# Patient Record
Sex: Male | Born: 1965 | Race: White | Hispanic: No | Smoking: Never smoker
Health system: Southern US, Community
[De-identification: ages and names within clinical notes are randomized; demographics above are authoritative.]

## PROBLEM LIST (undated history)

## (undated) DIAGNOSIS — E785 Hyperlipidemia, unspecified: Secondary | ICD-10-CM

## (undated) DIAGNOSIS — I739 Peripheral vascular disease, unspecified: Secondary | ICD-10-CM

## (undated) DIAGNOSIS — K3184 Gastroparesis: Secondary | ICD-10-CM

## (undated) DIAGNOSIS — K5900 Constipation, unspecified: Secondary | ICD-10-CM

## (undated) DIAGNOSIS — I10 Essential (primary) hypertension: Secondary | ICD-10-CM

## (undated) DIAGNOSIS — G629 Polyneuropathy, unspecified: Secondary | ICD-10-CM

## (undated) DIAGNOSIS — F419 Anxiety disorder, unspecified: Secondary | ICD-10-CM

## (undated) DIAGNOSIS — R339 Retention of urine, unspecified: Secondary | ICD-10-CM

## (undated) DIAGNOSIS — R569 Unspecified convulsions: Secondary | ICD-10-CM

## (undated) DIAGNOSIS — O2230 Deep phlebothrombosis in pregnancy, unspecified trimester: Secondary | ICD-10-CM

## (undated) DIAGNOSIS — K819 Cholecystitis, unspecified: Secondary | ICD-10-CM

## (undated) DIAGNOSIS — E119 Type 2 diabetes mellitus without complications: Secondary | ICD-10-CM

## (undated) DIAGNOSIS — I639 Cerebral infarction, unspecified: Secondary | ICD-10-CM

## (undated) DIAGNOSIS — L409 Psoriasis, unspecified: Secondary | ICD-10-CM

---

## 2017-08-23 ENCOUNTER — Emergency Department (HOSPITAL_COMMUNITY)
Admission: EM | Admit: 2017-08-23 | Discharge: 2017-08-23 | Disposition: A | Discharge status: Home / Self Care | Payer: Medicare Other | Attending: Emergency Medicine | Admitting: Emergency Medicine

## 2017-08-23 ENCOUNTER — Emergency Department (HOSPITAL_COMMUNITY): Payer: Medicare Other

## 2017-08-23 DIAGNOSIS — Y713 Surgical instruments, materials and cardiovascular devices (including sutures) associated with adverse incidents: Secondary | ICD-10-CM | POA: Insufficient documentation

## 2017-08-23 DIAGNOSIS — T82598A Other mechanical complication of other cardiac and vascular devices and implants, initial encounter: Secondary | ICD-10-CM | POA: Diagnosis present

## 2017-08-23 DIAGNOSIS — T82528A Displacement of other cardiac and vascular devices and implants, initial encounter: Secondary | ICD-10-CM

## 2017-08-23 DIAGNOSIS — Z95828 Presence of other vascular implants and grafts: Secondary | ICD-10-CM

## 2017-08-23 MED ORDER — SODIUM CHLORIDE 0.9% FLUSH
10.0000 mL | Freq: Two times a day (BID) | INTRAVENOUS | Status: DC
  Administered 2017-08-23: 10 mL

## 2017-08-23 MED ORDER — SODIUM CHLORIDE 0.9% FLUSH: 10.0000 mL | INTRAVENOUS | Status: DC | PRN

## 2017-08-23 NOTE — Progress Notes (Signed)
Peripherally Inserted Central Catheter/Midline Placement  The IV Nurse has discussed with the patient and/or persons authorized to consent for the patient, the purpose of this procedure and the potential benefits and risks involved with this procedure.  The benefits include less needle sticks, lab draws from the catheter, and the patient may be discharged home with the catheter. Risks include, but not limited to, infection, bleeding, blood clot (thrombus formation), and puncture of an artery; nerve damage and irregular heartbeat and possibility to perform a PICC exchange if needed/ordered by physician.  Alternatives to this procedure were also discussed.  B[employer] PICC patient education guide, fact sheet on infection prevention and patient information card has been provided to patient /or left at bedside.    PICC/Midline Placement Documentation  PICC Single Lumen 08/23/17 PICC Right Basilic 39 cm 0 cm (Active)  Indication for Insertion or Continuance of Line Home intravenous therapies (PICC only) 08/23/2017  7:00 PM  Exposed Catheter (cm) 0 cm 08/23/2017  7:00 PM  Dressing Change Due 08/30/17 08/23/2017  7:00 PM   Patient alert and oriented. This is his 5th PICC. States that he pulls the PICC out with his teeth when he gets bored. Soft sleeve placed over PICC site. Recommend tunneled line in chest if patient continues to dislodge PICC. Patient is aware of risk associated with this activity.    Romie Jumper 08/23/2017, 7:14 PM

## 2017-08-23 NOTE — ED Notes (Signed)
Unable to obtain triage hx review from pt due to cognitive deficits. Facility did not send medics with a medical record copy.

## 2017-08-23 NOTE — ED Notes (Signed)
Tried to call Lacinda Axon to give report. No answer. Plan to give bedside report to medics at discharge.

## 2017-08-23 NOTE — ED Triage Notes (Signed)
Pt has been sent from an SNF for a PICC line placement after the old one "reportedly came out."
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
## 2017-08-23 NOTE — Discharge Instructions (Signed)
PICC line was replaced today.  Please keep well protected so that patient will not pull.   Please return for worsening symptoms, including fever, confusion, redness/swelling around PICC line or any other symptoms concerning to you.

## 2017-08-23 NOTE — ED Notes (Signed)
Bed: [facility] Expected date:  Expected time:  Means of arrival:  Comments: EMS-needs PICC placed

## 2017-08-31 ENCOUNTER — Encounter (HOSPITAL_COMMUNITY): Payer: Self-pay | Admitting: Emergency Medicine

## 2017-08-31 ENCOUNTER — Emergency Department (HOSPITAL_COMMUNITY): Payer: Medicare Other

## 2017-08-31 ENCOUNTER — Inpatient Hospital Stay (HOSPITAL_COMMUNITY)
Admission: EM | Admit: 2017-08-31 | Discharge: 2017-09-06 | DRG: 071 | Disposition: A | Discharge status: Skilled Nursing Facility | Payer: Medicare Other | Attending: Internal Medicine | Admitting: Internal Medicine

## 2017-08-31 DIAGNOSIS — R4182 Altered mental status, unspecified: Secondary | ICD-10-CM | POA: Diagnosis not present

## 2017-08-31 DIAGNOSIS — F419 Anxiety disorder, unspecified: Secondary | ICD-10-CM | POA: Diagnosis present

## 2017-08-31 DIAGNOSIS — E785 Hyperlipidemia, unspecified: Secondary | ICD-10-CM | POA: Diagnosis present

## 2017-08-31 DIAGNOSIS — I69320 Aphasia following cerebral infarction: Secondary | ICD-10-CM | POA: Diagnosis not present

## 2017-08-31 DIAGNOSIS — Z515 Encounter for palliative care: Secondary | ICD-10-CM | POA: Diagnosis present

## 2017-08-31 DIAGNOSIS — I5030 Unspecified diastolic (congestive) heart failure: Secondary | ICD-10-CM | POA: Diagnosis not present

## 2017-08-31 DIAGNOSIS — I248 Other forms of acute ischemic heart disease: Secondary | ICD-10-CM | POA: Diagnosis present

## 2017-08-31 DIAGNOSIS — D696 Thrombocytopenia, unspecified: Secondary | ICD-10-CM | POA: Diagnosis present

## 2017-08-31 DIAGNOSIS — Z7902 Long term (current) use of antithrombotics/antiplatelets: Secondary | ICD-10-CM

## 2017-08-31 DIAGNOSIS — R400 Somnolence: Secondary | ICD-10-CM

## 2017-08-31 DIAGNOSIS — F0151 Vascular dementia with behavioral disturbance: Secondary | ICD-10-CM | POA: Diagnosis not present

## 2017-08-31 DIAGNOSIS — Z8673 Personal history of transient ischemic attack (TIA), and cerebral infarction without residual deficits: Secondary | ICD-10-CM | POA: Diagnosis not present

## 2017-08-31 DIAGNOSIS — R778 Other specified abnormalities of plasma proteins: Secondary | ICD-10-CM

## 2017-08-31 DIAGNOSIS — W19XXXS Unspecified fall, sequela: Secondary | ICD-10-CM | POA: Diagnosis not present

## 2017-08-31 DIAGNOSIS — Z79899 Other long term (current) drug therapy: Secondary | ICD-10-CM

## 2017-08-31 DIAGNOSIS — Z66 Do not resuscitate: Secondary | ICD-10-CM | POA: Diagnosis present

## 2017-08-31 DIAGNOSIS — R509 Fever, unspecified: Secondary | ICD-10-CM | POA: Diagnosis present

## 2017-08-31 DIAGNOSIS — I2510 Atherosclerotic heart disease of native coronary artery without angina pectoris: Secondary | ICD-10-CM | POA: Diagnosis present

## 2017-08-31 DIAGNOSIS — F0390 Unspecified dementia without behavioral disturbance: Secondary | ICD-10-CM | POA: Diagnosis present

## 2017-08-31 DIAGNOSIS — Y92129 Unspecified place in nursing home as the place of occurrence of the external cause: Secondary | ICD-10-CM | POA: Diagnosis not present

## 2017-08-31 DIAGNOSIS — R21 Rash and other nonspecific skin eruption: Secondary | ICD-10-CM | POA: Diagnosis present

## 2017-08-31 DIAGNOSIS — E876 Hypokalemia: Secondary | ICD-10-CM | POA: Diagnosis present

## 2017-08-31 DIAGNOSIS — I110 Hypertensive heart disease with heart failure: Secondary | ICD-10-CM | POA: Diagnosis present

## 2017-08-31 DIAGNOSIS — I69354 Hemiplegia and hemiparesis following cerebral infarction affecting left non-dominant side: Secondary | ICD-10-CM | POA: Diagnosis not present

## 2017-08-31 DIAGNOSIS — R451 Restlessness and agitation: Secondary | ICD-10-CM | POA: Diagnosis not present

## 2017-08-31 DIAGNOSIS — Z7984 Long term (current) use of oral hypoglycemic drugs: Secondary | ICD-10-CM

## 2017-08-31 DIAGNOSIS — I69398 Other sequelae of cerebral infarction: Secondary | ICD-10-CM | POA: Diagnosis not present

## 2017-08-31 DIAGNOSIS — D649 Anemia, unspecified: Secondary | ICD-10-CM

## 2017-08-31 DIAGNOSIS — R7989 Other specified abnormal findings of blood chemistry: Secondary | ICD-10-CM

## 2017-08-31 DIAGNOSIS — F329 Major depressive disorder, single episode, unspecified: Secondary | ICD-10-CM | POA: Diagnosis present

## 2017-08-31 DIAGNOSIS — Z886 Allergy status to analgesic agent status: Secondary | ICD-10-CM

## 2017-08-31 DIAGNOSIS — Z7189 Other specified counseling: Secondary | ICD-10-CM | POA: Diagnosis not present

## 2017-08-31 DIAGNOSIS — W19XXXA Unspecified fall, initial encounter: Secondary | ICD-10-CM

## 2017-08-31 DIAGNOSIS — G9341 Metabolic encephalopathy: Principal | ICD-10-CM | POA: Diagnosis present

## 2017-08-31 DIAGNOSIS — R627 Adult failure to thrive: Secondary | ICD-10-CM | POA: Diagnosis present

## 2017-08-31 DIAGNOSIS — Z885 Allergy status to narcotic agent status: Secondary | ICD-10-CM | POA: Diagnosis not present

## 2017-08-31 DIAGNOSIS — R7881 Bacteremia: Secondary | ICD-10-CM | POA: Diagnosis not present

## 2017-08-31 DIAGNOSIS — I69322 Dysarthria following cerebral infarction: Secondary | ICD-10-CM | POA: Diagnosis not present

## 2017-08-31 DIAGNOSIS — Z87820 Personal history of traumatic brain injury: Secondary | ICD-10-CM

## 2017-08-31 DIAGNOSIS — S0181XA Laceration without foreign body of other part of head, initial encounter: Secondary | ICD-10-CM | POA: Diagnosis present

## 2017-08-31 DIAGNOSIS — E1142 Type 2 diabetes mellitus with diabetic polyneuropathy: Secondary | ICD-10-CM | POA: Diagnosis present

## 2017-08-31 DIAGNOSIS — Z880 Allergy status to penicillin: Secondary | ICD-10-CM

## 2017-08-31 DIAGNOSIS — W050XXA Fall from non-moving wheelchair, initial encounter: Secondary | ICD-10-CM | POA: Diagnosis present

## 2017-08-31 DIAGNOSIS — E1151 Type 2 diabetes mellitus with diabetic peripheral angiopathy without gangrene: Secondary | ICD-10-CM | POA: Diagnosis present

## 2017-08-31 DIAGNOSIS — T368X5A Adverse effect of other systemic antibiotics, initial encounter: Secondary | ICD-10-CM | POA: Diagnosis present

## 2017-08-31 DIAGNOSIS — R404 Transient alteration of awareness: Secondary | ICD-10-CM | POA: Diagnosis not present

## 2017-08-31 DIAGNOSIS — I5032 Chronic diastolic (congestive) heart failure: Secondary | ICD-10-CM | POA: Diagnosis present

## 2017-08-31 DIAGNOSIS — L409 Psoriasis, unspecified: Secondary | ICD-10-CM | POA: Diagnosis present

## 2017-08-31 DIAGNOSIS — G9389 Other specified disorders of brain: Secondary | ICD-10-CM | POA: Diagnosis not present

## 2017-08-31 DIAGNOSIS — R296 Repeated falls: Secondary | ICD-10-CM | POA: Diagnosis present

## 2017-08-31 DIAGNOSIS — R569 Unspecified convulsions: Secondary | ICD-10-CM | POA: Diagnosis present

## 2017-08-31 DIAGNOSIS — R1310 Dysphagia, unspecified: Secondary | ICD-10-CM | POA: Diagnosis not present

## 2017-08-31 DIAGNOSIS — Z7982 Long term (current) use of aspirin: Secondary | ICD-10-CM

## 2017-08-31 HISTORY — DX: Anxiety disorder, unspecified: F41.9

## 2017-08-31 HISTORY — DX: Gastroparesis: K31.84

## 2017-08-31 HISTORY — DX: Deep phlebothrombosis in pregnancy, unspecified trimester: O22.30

## 2017-08-31 HISTORY — DX: Hyperlipidemia, unspecified: E78.5

## 2017-08-31 HISTORY — DX: Polyneuropathy, unspecified: G62.9

## 2017-08-31 HISTORY — DX: Type 2 diabetes mellitus without complications: E11.9

## 2017-08-31 HISTORY — DX: Cholecystitis, unspecified: K81.9

## 2017-08-31 HISTORY — DX: Cerebral infarction, unspecified: I63.9

## 2017-08-31 HISTORY — DX: Unspecified convulsions: R56.9

## 2017-08-31 HISTORY — DX: Psoriasis, unspecified: L40.9

## 2017-08-31 HISTORY — DX: Peripheral vascular disease, unspecified: I73.9

## 2017-08-31 HISTORY — DX: Retention of urine, unspecified: R33.9

## 2017-08-31 HISTORY — DX: Essential (primary) hypertension: I10

## 2017-08-31 HISTORY — DX: Constipation, unspecified: K59.00

## 2017-08-31 LAB — BLOOD GAS, VENOUS
Acid-Base Excess: 3.2 mmol/L — ABNORMAL HIGH (ref 0.0–2.0)
Bicarbonate: 28.2 mmol/L — ABNORMAL HIGH (ref 20.0–28.0)
O2 Saturation: 75.2 %
PH VEN: 7.386 (ref 7.250–7.430)
Patient temperature: 98.6
pCO2, Ven: 48.1 mmHg (ref 44.0–60.0)
pO2, Ven: 44.8 mmHg (ref 32.0–45.0)

## 2017-08-31 LAB — I-STAT CG4 LACTIC ACID, ED: LACTIC ACID, VENOUS: 1.61 mmol/L (ref 0.5–1.9)

## 2017-08-31 LAB — COMPREHENSIVE METABOLIC PANEL
ALBUMIN: 3.1 g/dL — AB (ref 3.5–5.0)
ALT: 28 U/L (ref 17–63)
AST: 45 U/L — AB (ref 15–41)
Alkaline Phosphatase: 58 U/L (ref 38–126)
Anion gap: 10 (ref 5–15)
BUN: 23 mg/dL — AB (ref 6–20)
CHLORIDE: 104 mmol/L (ref 101–111)
CO2: 28 mmol/L (ref 22–32)
Calcium: 8.6 mg/dL — ABNORMAL LOW (ref 8.9–10.3)
Creatinine, Ser: 1.1 mg/dL (ref 0.61–1.24)
GFR calc Af Amer: >60 mL/min (ref >60)
GLUCOSE: 132 mg/dL — AB (ref 65–99)
POTASSIUM: 3.1 mmol/L — AB (ref 3.5–5.1)
Sodium: 142 mmol/L (ref 135–145)
Total Bilirubin: 0.3 mg/dL (ref 0.3–1.2)
Total Protein: 6.7 g/dL (ref 6.5–8.1)

## 2017-08-31 LAB — URINALYSIS, COMPLETE (UACMP) WITH MICROSCOPIC
Bilirubin Urine: NEGATIVE
Glucose, UA: NEGATIVE mg/dL
Hgb urine dipstick: NEGATIVE
KETONES UR: 5 mg/dL — AB
Leukocytes, UA: NEGATIVE
Nitrite: NEGATIVE
PROTEIN: 100 mg/dL — AB
Specific Gravity, Urine: 1.029 (ref 1.005–1.030)
pH: 5 (ref 5.0–8.0)

## 2017-08-31 LAB — CBC WITH DIFFERENTIAL/PLATELET
Basophils Absolute: 0 10*3/uL (ref 0.0–0.1)
Basophils Relative: 0 %
EOS PCT: 14 %
Eosinophils Absolute: 1.2 10*3/uL — ABNORMAL HIGH (ref 0.0–0.7)
HCT: 35.4 % — ABNORMAL LOW (ref 39.0–52.0)
Hemoglobin: 11.4 g/dL — ABNORMAL LOW (ref 13.0–17.0)
LYMPHS ABS: 1.2 10*3/uL (ref 0.7–4.0)
LYMPHS PCT: 15 %
MCH: 29.3 pg (ref 26.0–34.0)
MCHC: 32.2 g/dL (ref 30.0–36.0)
MCV: 91 fL (ref 78.0–100.0)
Monocytes Absolute: 0.8 10*3/uL (ref 0.1–1.0)
Monocytes Relative: 10 %
Neutro Abs: 5 10*3/uL (ref 1.7–7.7)
Neutrophils Relative %: 61 %
PLATELETS: 145 10*3/uL — AB (ref 150–400)
RBC: 3.89 MIL/uL — AB (ref 4.22–5.81)
RDW: 14.4 % (ref 11.5–15.5)
WBC: 8.2 10*3/uL (ref 4.0–10.5)

## 2017-08-31 LAB — I-STAT TROPONIN, ED: TROPONIN I, POC: 1.54 ng/mL — AB (ref 0.00–0.08)

## 2017-08-31 LAB — VANCOMYCIN, TROUGH: VANCOMYCIN TR: 14 ug/mL — AB (ref 15–20)

## 2017-08-31 LAB — VALPROIC ACID LEVEL: VALPROIC ACID LVL: 55 ug/mL (ref 50.0–100.0)

## 2017-08-31 LAB — BRAIN NATRIURETIC PEPTIDE: B NATRIURETIC PEPTIDE 5: 75.4 pg/mL (ref 0.0–100.0)

## 2017-08-31 LAB — TSH: TSH: 0.883 u[IU]/mL (ref 0.350–4.500)

## 2017-08-31 LAB — PROTIME-INR
INR: 1.2
Prothrombin Time: 15.1 seconds (ref 11.4–15.2)

## 2017-08-31 LAB — CBG MONITORING, ED: GLUCOSE-CAPILLARY: 133 mg/dL — AB (ref 65–99)

## 2017-08-31 LAB — CARBAMAZEPINE LEVEL, TOTAL: CARBAMAZEPINE LVL: 5.5 ug/mL (ref 4.0–12.0)

## 2017-08-31 LAB — APTT: APTT: 31 s (ref 24–36)

## 2017-08-31 MED ORDER — VANCOMYCIN HCL 10 G IV SOLR
1500.0000 mg | Freq: Two times a day (BID) | INTRAVENOUS | Status: DC
  Administered 2017-08-31 – 2017-09-01 (×2): 1500 mg via INTRAVENOUS
  Filled 2017-08-31 (×4): qty 1500

## 2017-08-31 MED ORDER — PIPERACILLIN-TAZOBACTAM 3.375 G IVPB
3.3750 g | Freq: Three times a day (TID) | INTRAVENOUS | Status: DC
  Administered 2017-09-01 – 2017-09-02 (×5): 3.375 g via INTRAVENOUS
  Filled 2017-08-31 (×5): qty 50

## 2017-08-31 MED ORDER — DIPHENHYDRAMINE HCL 25 MG PO CAPS
25.0000 mg | ORAL_CAPSULE | Freq: Every evening | ORAL | Status: DC | PRN
  Administered 2017-08-31: 25 mg via ORAL
  Filled 2017-08-31: qty 1

## 2017-08-31 MED ORDER — ENOXAPARIN SODIUM 40 MG/0.4ML ~~LOC~~ SOLN
40.0000 mg | Freq: Every day | SUBCUTANEOUS | Status: DC
  Administered 2017-09-02 – 2017-09-05 (×4): 40 mg via SUBCUTANEOUS
  Filled 2017-08-31 (×6): qty 0.4

## 2017-08-31 MED ORDER — ONDANSETRON HCL 4 MG/2ML IJ SOLN: 4.0000 mg | Freq: Four times a day (QID) | INTRAMUSCULAR | Status: DC | PRN

## 2017-08-31 MED ORDER — PIPERACILLIN-TAZOBACTAM 3.375 G IVPB 30 MIN
3.3750 g | INTRAVENOUS | Status: AC
  Administered 2017-08-31: 3.375 g via INTRAVENOUS
  Filled 2017-08-31: qty 50

## 2017-08-31 MED ORDER — SODIUM CHLORIDE 0.9% FLUSH
3.0000 mL | Freq: Two times a day (BID) | INTRAVENOUS | Status: DC
  Administered 2017-08-31 – 2017-09-05 (×8): 3 mL via INTRAVENOUS

## 2017-08-31 MED ORDER — ONDANSETRON HCL 4 MG PO TABS: 4.0000 mg | ORAL_TABLET | Freq: Four times a day (QID) | ORAL | Status: DC | PRN

## 2017-08-31 NOTE — ED Provider Notes (Signed)
Medical screening examination/treatment/procedure(s) were conducted as a shared visit with non-physician practitioner(s) and myself.  I personally evaluated the patient during the encounter.   EKG Interpretation  Date/Time:  Saturday August 31 2017 15:06:19 EDT Ventricular Rate:  78 PR Interval:    QRS Duration: 82 QT Interval:  390 QTC Calculation: 445 R Axis:   19 Text Interpretation:  Sinus rhythm Probable inferior infarct, old Lateral leads are also involved Baseline wander in lead(s) V5 inferior T wave inversion/ no old comparison avilable Confirmed by Arby Barrette [phone]) on 08/31/2017 3:37:22 PM     Patient is brought from nursing home facility for fall from wheelchair. Reportedly, patient's baseline mental status is communicative although difficult to understand. On examination, the patient is very somnolent and is not respond with any verbal interaction. Some response to deep pain stimulus. Positive response to corneal brush. He does not have respiratory distress. He is breathing spontaneously with adequate air flow through both lung fields. Examination of extremities shows atrophied and contractured left upper extremity. The PICC line in his right upper extremity. Abdomen is soft and mildly obese. Lower extremities do not have any evident deformity. He does have a dressed abrasion on his left anterior shin but no significant surrounding cellulitis. Patient CT head does not show acute intracranial injury. At this point will continue with metabolic workup as possible etiology for apparent mental status change.   Arby Barrette, MD 08/31/17 [phone]
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
## 2017-08-31 NOTE — ED Notes (Signed)
ED Provider at bedside. 

## 2017-08-31 NOTE — ED Provider Notes (Signed)
[facility]-EMERGENCY DEPT Provider Note   CSN: [id number] Arrival date & time: 08/31/17  1354     History   Chief Complaint Chief Complaint  Patient presents with  . Fall    HPI Wesley Cohen is a 51 y.o. male with a PMHx of dementia, repeated falls, depression/anxiety, DM2, DVT (unclear when/why/etc), HTN, PVD, seizures, stroke with residual L sided deficits and aphasia, traumatic subdural hematoma, and foot wound on IV abx through PICC line, who presents to the ED via EMS from [facility] for evaluation of a fall which was witnessed by his roommate; per EMS report, pt was attempting to get up from his wheelchair and he fell in his room, reportedly there was no LOC, and he has a small abrasion to his R forehead. LEVEL 5 CAVEAT DUE TO DEMENTIA/AMS, majority of information provided by EMS and nursing home personnel. Pt very somnolent and difficult to arouse, unable to provide any of the history. Per MAR from nursing home, pt is on plavix and  ASA; he is also on tegretol and depakote. Ultram listed on MAR but hasn't been given in several weeks. He's also on lasix, unclear if he has hx of CHF. No further information provided by EMS.   After calling [facility] and speaking with Cala Bradford who took care of him yesterday and today, she reports that yesterday he had a cough (she's not sure how long he's had this). She mentions that he's usually A&Ox3, difficult to understand due to his aphasia, but typically able to answer questions appropriately, however she noticed today that he was more somnolent than usual; still answered questions appropriately with her today but just seemed very somnolent. Gets klonopin around 8-9am, but no narcotics given recently. She states that the fall was witnessed by his roommate, pt apparently attempted to transfer himself from his wheelchair into his bed, and fell onto the tile floor, striking his head. No LOC was reported. She denies any recent fevers,  vomiting, diarrhea, changes in urination, or any other illness/complaints that she's aware of. Remainder of history severely limited due to pt not being able to assist in history, and nursing home staff not being entirely sure of any other information.   OF NOTE: PT'S FACE SHEET FROM NURSING HOME STATES HE IS A DNR, HOWEVER HE DOES NOT ARRIVE WITH A GOLD FORM.   The history is provided by the patient, medical records, the EMS personnel and the nursing home. The history is limited by the absence of a caregiver. No language interpreter was used.  Fall  This is a new problem. The current episode started less than 1 hour ago. The problem occurs constantly. The problem has not changed since onset.Nothing aggravates the symptoms. Nothing relieves the symptoms. He has tried nothing for the symptoms. The treatment provided no relief.    Past Medical History:  Diagnosis Date  . Anxiety   . Cholecystitis   . Constipation   . Diabetes mellitus without complication (HCC)   . DVT (deep vein thrombosis) in pregnancy (HCC)   . Gastroparesis   . Peripheral vascular disease (HCC)   . Stroke (HCC)   . Urinary retention     There are no active problems to display for this patient.   No past surgical history on file.     Home Medications    Prior to Admission medications   Not on File    Family History No family history on file.  Social History Social History  Substance Use Topics  .  Smoking status: Not on file  . Smokeless tobacco: Not on file  . Alcohol use Not on file     Allergies   Hydrocodone; Morphine and related; Oxycodone; and Tylenol [acetaminophen]   Review of Systems Review of Systems  Unable to perform ROS: Dementia  Constitutional: Negative for fever.  Respiratory: Positive for cough.   Gastrointestinal: Negative for diarrhea and vomiting.  Genitourinary:       No changes in urine/malodorous urine  Skin: Positive for wound.  Allergic/Immunologic: Positive for  immunocompromised state (DM2).  Hematological: Bruises/bleeds easily (on plavix).  Psychiatric/Behavioral: Positive for confusion.   LEVEL 5 CAVEAT DUE TO DEMENTIA/AMS   Physical Exam Updated Vital Signs BP 123/68 (BP Location: Right Arm)   Pulse 84   Temp 98 F (36.7 C) (Oral)   Resp 17   SpO2 100%  RECTAL TEMP : Temp 97.6 F (36.4 C) (Rectal)   Physical Exam  Constitutional: Vital signs are normal. He appears well-developed and well-nourished.  Non-toxic appearance. No distress. Cervical collar in place.  Afebrile, nontoxic, NAD, snoring loudly and sleeping very deeply, difficult to arouse, moans with sternal rub but otherwise very somnolent.  HENT:  Head: Normocephalic. Head is with abrasion. Head is without raccoon's eyes, without Battle's sign and without contusion.  Nose: Nose normal.  Mouth/Throat: Uvula is midline, oropharynx is clear and moist and mucous membranes are normal. No trismus in the jaw. No uvula swelling.  Small superficial abrasion/scab to R temple, as pictured below. No other scalp lacerations/abrasions, no raccoon eyes or battle's sign, no contusions/hematomas, no scalp crepitus or deformity.   Eyes: Conjunctivae are normal. Right eye exhibits no discharge. Left eye exhibits no discharge. Right pupil is round. Left pupil is round. Pupils are equal.  Pupils pinpoint bilaterally, symmetric, doesn't respond to light due to pinpoint to begin with. Unable to assess EOM due to pt being so somnolent  Neck:  C-collar in place, unable to assess for tenderness due to pt somnolence. No bony stepoffs or deformities  Cardiovascular: Normal rate, regular rhythm, normal heart sounds and intact distal pulses.  Exam reveals no gallop and no friction rub.   No murmur heard. Pulmonary/Chest: Effort normal and breath sounds normal. No respiratory distress. He has no decreased breath sounds. He has no wheezes. He has no rhonchi. He has no rales.  Snoring loudly, so pulmonary  exam somewhat limited, but no definite rhonchi/rales/wheezing. No hypoxia, no increased WOB, maintaining airway well. SpO2 100% on RA.   Abdominal: Soft. Normal appearance and bowel sounds are normal. He exhibits no distension. There is no tenderness. There is no rigidity, no rebound, no guarding, no CVA tenderness, no tenderness at McBurney's point and negative Murphy's sign.  Doesn't seem to have any abdominal tenderness, but pt very somnolent. Doesn't arouse with palpation of abdomen. Soft, nondistended, +BS throughout  Musculoskeletal:  Pt unable to participate with evaluation due to somnolence. LUE contracture noted.   Neurological: He is unresponsive.  Moans to painful stimuli/sternal rub; otherwise doesn't arouse to verbal stimuli and sleeping deeply, snoring loudly; however, maintaining airway.  Full neurologic exam unable to be performed due to pt somnolence  Skin: Skin is warm and dry. Abrasion noted. No rash noted.  Very small abrasion to R temple as mentioned above and pictured below Erythematous patch of skin underneath lower abdomen, in intertrigonous fold, slightly macerated skin in one small area, no weeping or red streaking. No warmth.  PICC line in RUE, c/d/i, no evidence of infection around the  area.  Small healing ulcer to L lateral foot which is covered in scaly skin, no surrounding erythema or swelling, no red streaking, no drainage.   Psychiatric: He has a normal mood and affect.  Nursing note and vitals reviewed.      ED Treatments / Results  Labs (all labs ordered are listed, but only abnormal results are displayed) Labs Reviewed  COMPREHENSIVE METABOLIC PANEL - Abnormal; Notable for the following:       Result Value   Potassium 3.1 (*)    Glucose, Bld 132 (*)    BUN 23 (*)    Calcium 8.6 (*)    Albumin 3.1 (*)    AST 45 (*)    All other components within normal limits  CBC WITH DIFFERENTIAL/PLATELET - Abnormal; Notable for the following:    RBC 3.89 (*)     Hemoglobin 11.4 (*)    HCT 35.4 (*)    Platelets 145 (*)    Eosinophils Absolute 1.2 (*)    All other components within normal limits  BLOOD GAS, VENOUS - Abnormal; Notable for the following:    Bicarbonate 28.2 (*)    Acid-Base Excess 3.2 (*)    All other components within normal limits  CBG MONITORING, ED - Abnormal; Notable for the following:    Glucose-Capillary 133 (*)    All other components within normal limits  I-STAT TROPONIN, ED - Abnormal; Notable for the following:    Troponin i, poc 1.54 (*)    All other components within normal limits  PROTIME-INR  APTT  URINALYSIS, COMPLETE (UACMP) WITH MICROSCOPIC  CARBAMAZEPINE LEVEL, TOTAL  VALPROIC ACID LEVEL  AMMONIA  I-STAT CG4 LACTIC ACID, ED    EKG  EKG Interpretation  Date/Time:  Saturday August 31 2017 15:06:19 EDT Ventricular Rate:  78 PR Interval:    QRS Duration: 82 QT Interval:  390 QTC Calculation: 445 R Axis:   19 Text Interpretation:  Sinus rhythm Probable inferior infarct, old Lateral leads are also involved Baseline wander in lead(s) V5 inferior T wave inversion/ no old comparison avilable Confirmed by Arby Barrette [phone]) on 08/31/2017 3:37:22 PM       Radiology Dg Chest 1 View  Result Date: 08/31/2017 CLINICAL DATA:  Fall out of wheelchair. EXAM: CHEST 1 VIEW COMPARISON:  08/23/2017 radiographs FINDINGS: This is a low volume film. Cardiomediastinal silhouette is unchanged. Mild pulmonary vascular congestion present. Right PICC line noted with tip overlying the superior cavoatrial junction. No airspace disease, pleural effusion or pneumothorax. No acute bony abnormalities are identified. IMPRESSION: Mild pulmonary vascular congestion. Electronically Signed   By: Harmon Pier M.D.   On: 08/31/2017 14:49   Ct Head Wo Contrast  Result Date: 08/31/2017 CLINICAL DATA:  Head laceration after witnessed fall. No loss of consciousness. EXAM: CT HEAD WITHOUT CONTRAST CT CERVICAL SPINE WITHOUT CONTRAST  TECHNIQUE: Multidetector CT imaging of the head and cervical spine was performed following the standard protocol without intravenous contrast. Multiplanar CT image reconstructions of the cervical spine were also generated. COMPARISON:  None. FINDINGS: CT HEAD FINDINGS Brain: .Right frontal parietal encephalomalacia is noted consistent with old infarction. Left frontal encephalomalacia is also noted consistent with old infarction. No mass effect or midline shift is noted. Ventricular size is within normal limits. There is no evidence of mass lesion, hemorrhage or acute infarction. Vascular: No hyperdense vessel or unexpected calcification. Skull: Left parietal craniotomy defect is noted. No acute fracture is noted. Sinuses/Orbits: No acute finding. Other: None. CT CERVICAL SPINE FINDINGS Alignment:  Normal. Skull base and vertebrae: No acute fracture. No primary bone lesion or focal pathologic process. Soft tissues and spinal canal: No prevertebral fluid or swelling. No visible canal hematoma. Disc levels: Mild degenerative disc disease is noted at C5-6 with anterior osteophyte formation. Upper chest: Negative. Other: None. IMPRESSION: Bifrontal and right parietal encephalomalacia is noted consistent with old infarction. No acute intracranial abnormality seen. Mild degenerative changes are noted. No acute abnormality seen the cervical spine. Electronically Signed   By: Lupita Raider, M.D.   On: 08/31/2017 14:56   Ct Cervical Spine Wo Contrast  Result Date: 08/31/2017 CLINICAL DATA:  Head laceration after witnessed fall. No loss of consciousness. EXAM: CT HEAD WITHOUT CONTRAST CT CERVICAL SPINE WITHOUT CONTRAST TECHNIQUE: Multidetector CT imaging of the head and cervical spine was performed following the standard protocol without intravenous contrast. Multiplanar CT image reconstructions of the cervical spine were also generated. COMPARISON:  None. FINDINGS: CT HEAD FINDINGS Brain: .Right frontal parietal  encephalomalacia is noted consistent with old infarction. Left frontal encephalomalacia is also noted consistent with old infarction. No mass effect or midline shift is noted. Ventricular size is within normal limits. There is no evidence of mass lesion, hemorrhage or acute infarction. Vascular: No hyperdense vessel or unexpected calcification. Skull: Left parietal craniotomy defect is noted. No acute fracture is noted. Sinuses/Orbits: No acute finding. Other: None. CT CERVICAL SPINE FINDINGS Alignment: Normal. Skull base and vertebrae: No acute fracture. No primary bone lesion or focal pathologic process. Soft tissues and spinal canal: No prevertebral fluid or swelling. No visible canal hematoma. Disc levels: Mild degenerative disc disease is noted at C5-6 with anterior osteophyte formation. Upper chest: Negative. Other: None. IMPRESSION: Bifrontal and right parietal encephalomalacia is noted consistent with old infarction. No acute intracranial abnormality seen. Mild degenerative changes are noted. No acute abnormality seen the cervical spine. Electronically Signed   By: Lupita Raider, M.D.   On: 08/31/2017 14:56    Procedures Procedures (including critical care time)  CRITICAL CARE Performed by: Rhona Raider   Total critical care time: 35 minutes  Critical care time was exclusive of separately billable procedures and treating other patients.  Critical care was necessary to treat or prevent imminent or life-threatening deterioration.  Critical care was time spent personally by me on the following activities: development of treatment plan with patient and/or surrogate as well as nursing, discussions with consultants, evaluation of patient's response to treatment, examination of patient, obtaining history from patient or surrogate, ordering and performing treatments and interventions, ordering and review of laboratory studies, ordering and review of radiographic studies, pulse oximetry and  re-evaluation of patient's condition.   Medications Ordered in ED Medications - No data to display   Initial Impression / Assessment and Plan / ED Course  I have reviewed the triage vital signs and the nursing notes.  Pertinent labs & imaging results that were available during my care of the patient were reviewed by me and considered in my medical decision making (see chart for details).     51 y.o. male here for witnessed fall from wheelchair. Level 5 caveat due to AMS, pt very somnolent and difficult to arouse, responds to painful stimuli/sternal rub but only moans and then goes back to sleep. Pupils pinpoint bilaterally. Small superficial abrasion to R temple, which has already scabbed, no lacerations or bony instability noted. Also has erythema under his lower abdomen in the intertrigonous area, one area of slightly macerated skin, no drainage/weeping. Very difficult to get  any information from him due to AMS/somnolence. MAR from nursing home doesn't show any narcotics given any time recently (ultram listed but not given in at least a few weeks). Will get labs, VBG, U/A, depakote and tegretol levels, EKG, rectal temp, CT head/neck and CXR. Will attempt to contact facility as well. Discussed case with my attending Dr. Donnald Garre who agrees with plan.  3:33 PM Rectal temp WNL at 97.6. CT head/neck negative for acute findings. CXR with mild pulmonary vascular congestion. EKG without acute ischemic findings, TWI in limb lead 3 and AVF, no prior for comparison. Additional information from nursing home staff obtained, still very limited because the staff member was not involved in the majority of his care and didn't know him tremendously well, but states he's usually A&O x3 however this morning seemed more somnolent. Not on narcotics at facility. Klonopin given around 8-9am and fall occurred at 1:30pm. She noticed a cough yesterday but can't tell how long it's been going on since she doesn't take care  of him daily. Denies any other symptoms/illness. Will add-on ammonia level as well. Awaiting remainder of work up at this time. Will continue to assess.   4:00 PM Nursing staff reporting that his Troponin was elevated at 1.54; given the abnormal EKG, will discuss this with cardiology; no Echo on file here so unclear if pt may have CHF (on lasix, so maybe?); will discuss with cardiology. Thus far labs resulting and show: lactic WNL. VBG WNL. CBG 133. CBC w/diff with mild anemia and mild thrombocytopenia (unclear baseline). Remainder in process. Will continue to monitor and reassess shortly. Will hold off on starting heparin just yet.   4:40 PM Dr. Excell Seltzer of cardiology returning page, does not feel pt needs to be started on heparin and has no further recommendations regarding his elevated troponin at this time; states if hospitalist feels they need inpatient cardiology consultation, then please reach out to inpatient cardiology service at that time.  Of note, further labs resulting, which show: CMP with mildly low K 3.1, pt still very somnolent and wouldn't be able to receive PO at this time; will hold off on treating this at this time. INR/APTT WNL. Awaiting U/A, valproic and carbamazepine level, and ammonia level. Will proceed with admission  4:52 PM Labs still pending, urine still not done.  Dr. Izola Price of [facility] returning page and will admit. Requested that we look at his sacral area for ulceration; per Pasty Arch NT and Dairl Ponder RN, when they did a rectal temp, pt did not have any skin break down or ulcerations on sacral area. I just looked at his feet, there is a small healing ulcer to L lateral foot, covered in scaly skin, no drainage or surrounding erythema.  Holding orders to be placed by admitting team. Please see their notes for further documentation of care. I appreciate their help with this pleasant pt's care. Pt stable at time of admission.    Final Clinical Impressions(s) / ED Diagnoses    Final diagnoses:  Somnolence  Elevated troponin  Anemia, unspecified type  Hypokalemia    New Prescriptions New Prescriptions   No medications on [address] [place], [place] 08/31/17 1655    Arby Barrette, MD 08/31/17 1739
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
## 2017-08-31 NOTE — ED Triage Notes (Signed)
Per EMS, patient from [place], had witnessed fall trying to get out of his wheelchair. Laceration to right forehead. Patient is nonambulatory. Hx stroke. Left arm weakness and aphasia from previous stroke. Denies LOC. Denies blood thinners.   BP 128/66 HR 82 RR 18 O2 96% CBG 196

## 2017-08-31 NOTE — ED Notes (Signed)
Condom cath applied

## 2017-08-31 NOTE — Progress Notes (Signed)
Pharmacy Antibiotic Note  Wesley Cohen is a 51 y.o. male admitted on 08/31/2017 with wound infection.  Pharmacy has been consulted for Vancomycin dosing.  VT is 14 but was drawn late due to transfer to hospital.  This level should provide true VT at desired goal, will check level after third dose to assure.  Plan: Vancomycin  IV every 12 hours.  Goal trough 15-20 mcg/mL.  Height:  (170.2 cm) Weight: 190 lb 4.1 oz (86.3 kg) IBW/kg (Calculated) : 66.1  Temp (24hrs), Avg:97.8 F (36.6 C), Min:97.6 F (36.4 C), Max:98 F (36.7 C)   Recent Labs Lab 08/31/17 1420 08/31/17 1541 08/31/17 2008  WBC 8.2  --   --   CREATININE 1.10  --   --   LATICACIDVEN  --  1.61  --   VANCOTROUGH  --   --  14*    Estimated Creatinine Clearance: 83.4 mL/min (by C-G formula based on SCr of 1.1 mg/dL).    Allergies  Allergen Reactions  . Hydrocodone   . Morphine And Related   . Oxycodone   . Tylenol [Acetaminophen]     Antimicrobials this admission: PTA-> 9/29 Zosyn >>   PTA -> 9/29 Vanc >>    Dose adjustments this admission: -  Microbiology results: pending  Thank you for allowing pharmacy to be a part of this patient's care.  Aleene Davidson Crowford 08/31/2017 11:06 PM

## 2017-09-01 ENCOUNTER — Other Ambulatory Visit (HOSPITAL_COMMUNITY): Payer: Medicare Other

## 2017-09-01 ENCOUNTER — Inpatient Hospital Stay (HOSPITAL_COMMUNITY): Payer: Medicare Other

## 2017-09-01 LAB — URINALYSIS, ROUTINE W REFLEX MICROSCOPIC
Bilirubin Urine: NEGATIVE
Glucose, UA: NEGATIVE mg/dL
HGB URINE DIPSTICK: NEGATIVE
Ketones, ur: 20 mg/dL — AB
Leukocytes, UA: NEGATIVE
NITRITE: NEGATIVE
PROTEIN: 100 mg/dL — AB
SPECIFIC GRAVITY, URINE: 1.025 (ref 1.005–1.030)
pH: 5 (ref 5.0–8.0)

## 2017-09-01 LAB — CBC
HEMATOCRIT: 36.8 % — AB (ref 39.0–52.0)
Hemoglobin: 11.8 g/dL — ABNORMAL LOW (ref 13.0–17.0)
MCH: 29 pg (ref 26.0–34.0)
MCHC: 32.1 g/dL (ref 30.0–36.0)
MCV: 90.4 fL (ref 78.0–100.0)
PLATELETS: 178 10*3/uL (ref 150–400)
RBC: 4.07 MIL/uL — AB (ref 4.22–5.81)
RDW: 14.2 % (ref 11.5–15.5)
WBC: 7.1 10*3/uL (ref 4.0–10.5)

## 2017-09-01 LAB — BASIC METABOLIC PANEL
Anion gap: 10 (ref 5–15)
BUN: 19 mg/dL (ref 6–20)
CO2: 28 mmol/L (ref 22–32)
CREATININE: 1.05 mg/dL (ref 0.61–1.24)
Calcium: 8.4 mg/dL — ABNORMAL LOW (ref 8.9–10.3)
Chloride: 102 mmol/L (ref 101–111)
Glucose, Bld: 180 mg/dL — ABNORMAL HIGH (ref 65–99)
POTASSIUM: 3.3 mmol/L — AB (ref 3.5–5.1)
SODIUM: 140 mmol/L (ref 135–145)

## 2017-09-01 LAB — LACTIC ACID, PLASMA
Lactic Acid, Venous: 0.9 mmol/L (ref 0.5–1.9)
Lactic Acid, Venous: 1.5 mmol/L (ref 0.5–1.9)

## 2017-09-01 LAB — AMMONIA: Ammonia: 27 umol/L (ref 9–35)

## 2017-09-01 LAB — TROPONIN I
TROPONIN I: 1 ng/mL — AB (ref <0.03)
TROPONIN I: 0.99 ng/mL — AB (ref <0.03)
Troponin I: 1.28 ng/mL (ref <0.03)

## 2017-09-01 LAB — GLUCOSE, CAPILLARY
Glucose-Capillary: 126 mg/dL — ABNORMAL HIGH (ref 65–99)
Glucose-Capillary: 194 mg/dL — ABNORMAL HIGH (ref 65–99)

## 2017-09-01 LAB — HIV ANTIBODY (ROUTINE TESTING W REFLEX): HIV SCREEN 4TH GENERATION: NONREACTIVE

## 2017-09-01 LAB — PROCALCITONIN: Procalcitonin: 0.41 ng/mL

## 2017-09-01 LAB — HEMOGLOBIN A1C
HEMOGLOBIN A1C: 6.6 % — AB (ref 4.8–5.6)
Mean Plasma Glucose: 142.72 mg/dL

## 2017-09-01 LAB — MRSA PCR SCREENING: MRSA by PCR: POSITIVE — AB

## 2017-09-01 MED ORDER — LORAZEPAM 2 MG/ML IJ SOLN
2.0000 mg | Freq: Once | INTRAMUSCULAR | Status: AC
  Administered 2017-09-01: 2 mg via INTRAVENOUS
  Filled 2017-09-01: qty 1

## 2017-09-01 MED ORDER — OLANZAPINE 5 MG PO TABS
10.0000 mg | ORAL_TABLET | Freq: Every day | ORAL | Status: DC
  Administered 2017-09-01 – 2017-09-04 (×4): 10 mg via ORAL
  Filled 2017-09-01: qty 1
  Filled 2017-09-01: qty 2
  Filled 2017-09-01: qty 1
  Filled 2017-09-01: qty 2

## 2017-09-01 MED ORDER — ASPIRIN EC 81 MG PO TBEC
81.0000 mg | DELAYED_RELEASE_TABLET | Freq: Every day | ORAL | Status: DC
  Administered 2017-09-01 – 2017-09-05 (×5): 81 mg via ORAL
  Filled 2017-09-01 (×5): qty 1

## 2017-09-01 MED ORDER — METOPROLOL TARTRATE 25 MG PO TABS
37.5000 mg | ORAL_TABLET | Freq: Every day | ORAL | Status: DC
  Administered 2017-09-01 – 2017-09-04 (×4): 37.5 mg via ORAL
  Filled 2017-09-01: qty 1
  Filled 2017-09-01: qty 2
  Filled 2017-09-01: qty 1
  Filled 2017-09-01: qty 2

## 2017-09-01 MED ORDER — CLOPIDOGREL BISULFATE 75 MG PO TABS
75.0000 mg | ORAL_TABLET | Freq: Every day | ORAL | Status: DC
  Administered 2017-09-01 – 2017-09-05 (×5): 75 mg via ORAL
  Filled 2017-09-01 (×5): qty 1

## 2017-09-01 MED ORDER — PANTOPRAZOLE SODIUM 40 MG PO TBEC
40.0000 mg | DELAYED_RELEASE_TABLET | Freq: Every day | ORAL | Status: DC
  Administered 2017-09-01 – 2017-09-05 (×5): 40 mg via ORAL
  Filled 2017-09-01 (×5): qty 1

## 2017-09-01 MED ORDER — METFORMIN HCL 500 MG PO TABS
500.0000 mg | ORAL_TABLET | Freq: Every day | ORAL | Status: DC
  Administered 2017-09-02 – 2017-09-05 (×4): 500 mg via ORAL
  Filled 2017-09-01 (×4): qty 1

## 2017-09-01 MED ORDER — POTASSIUM CHLORIDE CRYS ER 20 MEQ PO TBCR
40.0000 meq | EXTENDED_RELEASE_TABLET | Freq: Once | ORAL | Status: AC
  Administered 2017-09-01: 40 meq via ORAL
  Filled 2017-09-01: qty 2

## 2017-09-01 MED ORDER — TRAMADOL HCL 50 MG PO TABS
50.0000 mg | ORAL_TABLET | Freq: Every day | ORAL | Status: DC | PRN
  Administered 2017-09-04: 50 mg via ORAL
  Filled 2017-09-01: qty 1

## 2017-09-01 MED ORDER — CARBAMAZEPINE 200 MG PO TABS
200.0000 mg | ORAL_TABLET | Freq: Two times a day (BID) | ORAL | Status: DC
  Administered 2017-09-01 – 2017-09-05 (×9): 200 mg via ORAL
  Filled 2017-09-01 (×9): qty 1

## 2017-09-01 MED ORDER — DIVALPROEX SODIUM ER 500 MG PO TB24
1000.0000 mg | ORAL_TABLET | Freq: Two times a day (BID) | ORAL | Status: DC
  Administered 2017-09-01 – 2017-09-05 (×9): 1000 mg via ORAL
  Filled 2017-09-01: qty 4
  Filled 2017-09-01 (×3): qty 2
  Filled 2017-09-01 (×3): qty 4
  Filled 2017-09-01 (×2): qty 2
  Filled 2017-09-01: qty 4

## 2017-09-01 MED ORDER — CLONAZEPAM 1 MG PO TABS
1.0000 mg | ORAL_TABLET | Freq: Two times a day (BID) | ORAL | Status: DC
  Administered 2017-09-01 – 2017-09-05 (×9): 1 mg via ORAL
  Filled 2017-09-01 (×9): qty 1

## 2017-09-01 MED ORDER — IBUPROFEN 200 MG PO TABS
400.0000 mg | ORAL_TABLET | Freq: Four times a day (QID) | ORAL | Status: DC | PRN
  Administered 2017-09-01 – 2017-09-05 (×3): 400 mg via ORAL
  Filled 2017-09-01 (×3): qty 2

## 2017-09-01 MED ORDER — FLUOXETINE HCL 20 MG PO CAPS
20.0000 mg | ORAL_CAPSULE | Freq: Every day | ORAL | Status: DC
  Administered 2017-09-02 – 2017-09-05 (×4): 20 mg via ORAL
  Filled 2017-09-01 (×5): qty 1

## 2017-09-01 MED ORDER — HYDROCERIN EX CREA
TOPICAL_CREAM | Freq: Two times a day (BID) | CUTANEOUS | Status: DC
  Administered 2017-09-01 – 2017-09-05 (×8): via TOPICAL
  Filled 2017-09-01: qty 113

## 2017-09-01 MED ORDER — EUCERIN EX CREA
1.0000 | TOPICAL_CREAM | Freq: Two times a day (BID) | CUTANEOUS | Status: DC
Start: 2017-09-01 — End: 2017-09-01

## 2017-09-01 MED ORDER — DIPHENHYDRAMINE HCL 25 MG PO CAPS
25.0000 mg | ORAL_CAPSULE | ORAL | Status: DC | PRN
  Administered 2017-09-01 – 2017-09-05 (×10): 25 mg via ORAL
  Filled 2017-09-01 (×10): qty 1

## 2017-09-01 MED ORDER — ATORVASTATIN CALCIUM 20 MG PO TABS
20.0000 mg | ORAL_TABLET | Freq: Every day | ORAL | Status: DC
  Administered 2017-09-01 – 2017-09-05 (×5): 20 mg via ORAL
  Filled 2017-09-01: qty 2
  Filled 2017-09-01: qty 1
  Filled 2017-09-01: qty 2
  Filled 2017-09-01 (×2): qty 1

## 2017-09-01 MED ORDER — CELECOXIB 100 MG PO CAPS
100.0000 mg | ORAL_CAPSULE | Freq: Two times a day (BID) | ORAL | Status: DC
  Administered 2017-09-01 – 2017-09-05 (×9): 100 mg via ORAL
  Filled 2017-09-01 (×9): qty 1

## 2017-09-01 MED ORDER — DOCUSATE SODIUM 100 MG PO CAPS
100.0000 mg | ORAL_CAPSULE | Freq: Two times a day (BID) | ORAL | Status: DC
  Administered 2017-09-01 – 2017-09-05 (×8): 100 mg via ORAL
  Filled 2017-09-01 (×9): qty 1

## 2017-09-01 MED ORDER — TAMSULOSIN HCL 0.4 MG PO CAPS
0.4000 mg | ORAL_CAPSULE | Freq: Every day | ORAL | Status: DC
  Administered 2017-09-01 – 2017-09-05 (×5): 0.4 mg via ORAL
  Filled 2017-09-01 (×5): qty 1

## 2017-09-01 MED ORDER — BACLOFEN 10 MG PO TABS
5.0000 mg | ORAL_TABLET | Freq: Three times a day (TID) | ORAL | Status: DC
  Administered 2017-09-01 – 2017-09-05 (×14): 5 mg via ORAL
  Filled 2017-09-01 (×15): qty 1

## 2017-09-01 MED ORDER — GABAPENTIN 400 MG PO CAPS
800.0000 mg | ORAL_CAPSULE | Freq: Three times a day (TID) | ORAL | Status: DC
  Administered 2017-09-01 – 2017-09-05 (×14): 800 mg via ORAL
  Filled 2017-09-01 (×15): qty 2

## 2017-09-01 MED ORDER — FENTANYL CITRATE (PF) 100 MCG/2ML IJ SOLN
25.0000 ug | Freq: Once | INTRAMUSCULAR | Status: AC
  Administered 2017-09-01: 25 ug via INTRAVENOUS
  Filled 2017-09-01: qty 2

## 2017-09-01 MED ORDER — AMANTADINE HCL 100 MG PO CAPS
100.0000 mg | ORAL_CAPSULE | Freq: Two times a day (BID) | ORAL | Status: DC
  Administered 2017-09-01 – 2017-09-05 (×9): 100 mg via ORAL
  Filled 2017-09-01 (×9): qty 1

## 2017-09-01 MED ORDER — INSULIN ASPART 100 UNIT/ML ~~LOC~~ SOLN
0.0000 [IU] | Freq: Three times a day (TID) | SUBCUTANEOUS | Status: DC
  Administered 2017-09-01: 1 [IU] via SUBCUTANEOUS
  Administered 2017-09-02 (×3): 2 [IU] via SUBCUTANEOUS
  Administered 2017-09-03: 1 [IU] via SUBCUTANEOUS
  Administered 2017-09-03 – 2017-09-04 (×3): 2 [IU] via SUBCUTANEOUS
  Administered 2017-09-04 – 2017-09-05 (×3): 1 [IU] via SUBCUTANEOUS
  Administered 2017-09-05 (×2): 2 [IU] via SUBCUTANEOUS

## 2017-09-01 MED ORDER — SENNA 8.6 MG PO TABS
17.2000 mg | ORAL_TABLET | Freq: Two times a day (BID) | ORAL | Status: DC
  Administered 2017-09-01 – 2017-09-05 (×7): 17.2 mg via ORAL
  Filled 2017-09-01 (×8): qty 2

## 2017-09-01 NOTE — Progress Notes (Signed)
Patient complaining of right arm itching. Patient pulled out PICC line. Rash still present to torso and extremities shortly after vancomycin started. Dr. Izola Price notified

## 2017-09-01 NOTE — Progress Notes (Signed)
Dr. Izola Price notified of previous PICC notes, this is to be #6 PICC per records and pt pulls tem out frequently, last placement noted was 9-18, 9-21.  PICC d/c'ed until further notice per order.
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
## 2017-09-01 NOTE — Progress Notes (Signed)
Dr Izola Price paged regarding PICC
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
## 2017-09-01 NOTE — Progress Notes (Deleted)
CRITICAL VALUE ALERT  Critical Value:  Troponin 1.28  Date & Time Notied:  09/01/2017 0042   Provider Notified: X. Blount  Orders Received/Actions taken: Awaiting Orders, will continue to monitor.

## 2017-09-02 ENCOUNTER — Inpatient Hospital Stay (HOSPITAL_COMMUNITY): Payer: Medicare Other

## 2017-09-02 ENCOUNTER — Encounter (HOSPITAL_COMMUNITY): Payer: Self-pay

## 2017-09-02 DIAGNOSIS — Z885 Allergy status to narcotic agent status: Secondary | ICD-10-CM

## 2017-09-02 DIAGNOSIS — R21 Rash and other nonspecific skin eruption: Secondary | ICD-10-CM

## 2017-09-02 DIAGNOSIS — Z886 Allergy status to analgesic agent status: Secondary | ICD-10-CM

## 2017-09-02 DIAGNOSIS — R4182 Altered mental status, unspecified: Secondary | ICD-10-CM

## 2017-09-02 DIAGNOSIS — Z7189 Other specified counseling: Secondary | ICD-10-CM

## 2017-09-02 DIAGNOSIS — I69398 Other sequelae of cerebral infarction: Secondary | ICD-10-CM

## 2017-09-02 DIAGNOSIS — I69354 Hemiplegia and hemiparesis following cerebral infarction affecting left non-dominant side: Secondary | ICD-10-CM

## 2017-09-02 DIAGNOSIS — R509 Fever, unspecified: Secondary | ICD-10-CM

## 2017-09-02 DIAGNOSIS — Z880 Allergy status to penicillin: Secondary | ICD-10-CM

## 2017-09-02 DIAGNOSIS — Z515 Encounter for palliative care: Secondary | ICD-10-CM

## 2017-09-02 DIAGNOSIS — G9389 Other specified disorders of brain: Secondary | ICD-10-CM

## 2017-09-02 DIAGNOSIS — I5030 Unspecified diastolic (congestive) heart failure: Secondary | ICD-10-CM

## 2017-09-02 DIAGNOSIS — W19XXXS Unspecified fall, sequela: Secondary | ICD-10-CM

## 2017-09-02 LAB — BLOOD CULTURE ID PANEL (REFLEXED)
Acinetobacter baumannii: NOT DETECTED
CANDIDA ALBICANS: NOT DETECTED
CANDIDA GLABRATA: NOT DETECTED
CANDIDA PARAPSILOSIS: NOT DETECTED
CANDIDA TROPICALIS: NOT DETECTED
Candida krusei: NOT DETECTED
ENTEROBACTER CLOACAE COMPLEX: NOT DETECTED
Enterobacteriaceae species: NOT DETECTED
Enterococcus species: NOT DETECTED
Escherichia coli: NOT DETECTED
HAEMOPHILUS INFLUENZAE: NOT DETECTED
KLEBSIELLA OXYTOCA: NOT DETECTED
KLEBSIELLA PNEUMONIAE: NOT DETECTED
Listeria monocytogenes: NOT DETECTED
Methicillin resistance: DETECTED — AB
NEISSERIA MENINGITIDIS: NOT DETECTED
PROTEUS SPECIES: NOT DETECTED
Pseudomonas aeruginosa: NOT DETECTED
STAPHYLOCOCCUS SPECIES: DETECTED — AB
STREPTOCOCCUS PYOGENES: NOT DETECTED
STREPTOCOCCUS SPECIES: NOT DETECTED
Serratia marcescens: NOT DETECTED
Staphylococcus aureus (BCID): NOT DETECTED
Streptococcus agalactiae: NOT DETECTED
Streptococcus pneumoniae: NOT DETECTED

## 2017-09-02 LAB — CBC
HCT: 32.5 % — ABNORMAL LOW (ref 39.0–52.0)
Hemoglobin: 11 g/dL — ABNORMAL LOW (ref 13.0–17.0)
MCH: 29.2 pg (ref 26.0–34.0)
MCHC: 33.8 g/dL (ref 30.0–36.0)
MCV: 86.2 fL (ref 78.0–100.0)
Platelets: 143 10*3/uL — ABNORMAL LOW (ref 150–400)
RBC: 3.77 MIL/uL — ABNORMAL LOW (ref 4.22–5.81)
RDW: 14.2 % (ref 11.5–15.5)
WBC: 5.3 10*3/uL (ref 4.0–10.5)

## 2017-09-02 LAB — BASIC METABOLIC PANEL
Anion gap: 15 (ref 5–15)
BUN: 17 mg/dL (ref 6–20)
CO2: 23 mmol/L (ref 22–32)
CREATININE: 1.14 mg/dL (ref 0.61–1.24)
Calcium: 9 mg/dL (ref 8.9–10.3)
Chloride: 104 mmol/L (ref 101–111)
GFR calc Af Amer: >60 mL/min (ref >60)
Glucose, Bld: 174 mg/dL — ABNORMAL HIGH (ref 65–99)
Potassium: 5.5 mmol/L — ABNORMAL HIGH (ref 3.5–5.1)
SODIUM: 142 mmol/L (ref 135–145)

## 2017-09-02 LAB — GLUCOSE, CAPILLARY
GLUCOSE-CAPILLARY: 185 mg/dL — AB (ref 65–99)
GLUCOSE-CAPILLARY: 186 mg/dL — AB (ref 65–99)
GLUCOSE-CAPILLARY: 165 mg/dL — AB (ref 65–99)
Glucose-Capillary: 158 mg/dL — ABNORMAL HIGH (ref 65–99)

## 2017-09-02 LAB — ECHOCARDIOGRAM COMPLETE
Height: 67 in
Weight: 3030 oz

## 2017-09-02 LAB — URINE CULTURE

## 2017-09-02 MED ORDER — MUPIROCIN 2 % EX OINT
1.0000 "application " | TOPICAL_OINTMENT | Freq: Two times a day (BID) | CUTANEOUS | Status: DC
  Administered 2017-09-02 – 2017-09-05 (×7): 1 via NASAL
  Filled 2017-09-02 (×3): qty 22

## 2017-09-02 MED ORDER — CHLORHEXIDINE GLUCONATE CLOTH 2 % EX PADS
6.0000 | MEDICATED_PAD | Freq: Every day | CUTANEOUS | Status: DC
  Administered 2017-09-02 – 2017-09-05 (×4): 6 via TOPICAL
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
## 2017-09-02 NOTE — Progress Notes (Signed)
Alerted RN that patient has an allergy to Penicillin

## 2017-09-02 NOTE — Progress Notes (Signed)
Dr Izola Price notifed of temp 101.7 this morning and of rash on front and back. Patient refuses to wear oxygen.

## 2017-09-02 NOTE — Progress Notes (Signed)
  Echocardiogram 2D Echocardiogram has been performed.  Technically difficult study due to patient non compliance. Patient unable to stay awake during exam and calm respirations.   Wesley Cohen 09/02/2017, 8:48 AM
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
## 2017-09-02 NOTE — Progress Notes (Signed)
PHARMACY - PHYSICIAN COMMUNICATION CRITICAL VALUE ALERT - BLOOD CULTURE IDENTIFICATION (BCID)  Results for orders placed or performed during the hospital encounter of 08/31/17  Blood Culture ID Panel (Reflexed) (Collected: 09/01/2017  3:20 PM)  Result Value Ref Range   Enterococcus species NOT DETECTED NOT DETECTED   Listeria monocytogenes NOT DETECTED NOT DETECTED   Staphylococcus species DETECTED (A) NOT DETECTED   Staphylococcus aureus NOT DETECTED NOT DETECTED   Methicillin resistance DETECTED (A) NOT DETECTED   Streptococcus species NOT DETECTED NOT DETECTED   Streptococcus agalactiae NOT DETECTED NOT DETECTED   Streptococcus pneumoniae NOT DETECTED NOT DETECTED   Streptococcus pyogenes NOT DETECTED NOT DETECTED   Acinetobacter baumannii NOT DETECTED NOT DETECTED   Enterobacteriaceae species NOT DETECTED NOT DETECTED   Enterobacter cloacae complex NOT DETECTED NOT DETECTED   Escherichia coli NOT DETECTED NOT DETECTED   Klebsiella oxytoca NOT DETECTED NOT DETECTED   Klebsiella pneumoniae NOT DETECTED NOT DETECTED   Proteus species NOT DETECTED NOT DETECTED   Serratia marcescens NOT DETECTED NOT DETECTED   Haemophilus influenzae NOT DETECTED NOT DETECTED   Neisseria meningitidis NOT DETECTED NOT DETECTED   Pseudomonas aeruginosa NOT DETECTED NOT DETECTED   Candida albicans NOT DETECTED NOT DETECTED   Candida glabrata NOT DETECTED NOT DETECTED   Candida krusei NOT DETECTED NOT DETECTED   Candida parapsilosis NOT DETECTED NOT DETECTED   Candida tropicalis NOT DETECTED NOT DETECTED    Name of physician (or Provider) Contacted: Audrea Muscat NP  Changes to prescribed antibiotics required: no abx at current time. Abx dc'ed by ID earlier today    Adalberto Cole, PharmD, BCPS Pager [phone] 09/02/2017 8:41 PM

## 2017-09-03 DIAGNOSIS — R7881 Bacteremia: Secondary | ICD-10-CM

## 2017-09-03 LAB — BASIC METABOLIC PANEL
Anion gap: 11 (ref 5–15)
BUN: 18 mg/dL (ref 6–20)
CO2: 26 mmol/L (ref 22–32)
Calcium: 8.6 mg/dL — ABNORMAL LOW (ref 8.9–10.3)
Chloride: 103 mmol/L (ref 101–111)
Creatinine, Ser: 1.18 mg/dL (ref 0.61–1.24)
GFR calc Af Amer: >60 mL/min (ref >60)
GFR calc non Af Amer: >60 mL/min (ref >60)
Glucose, Bld: 168 mg/dL — ABNORMAL HIGH (ref 65–99)
Potassium: 3.4 mmol/L — ABNORMAL LOW (ref 3.5–5.1)
Sodium: 140 mmol/L (ref 135–145)

## 2017-09-03 LAB — GLUCOSE, CAPILLARY
Glucose-Capillary: 164 mg/dL — ABNORMAL HIGH (ref 65–99)
Glucose-Capillary: 136 mg/dL — ABNORMAL HIGH (ref 65–99)
Glucose-Capillary: 143 mg/dL — ABNORMAL HIGH (ref 65–99)

## 2017-09-03 LAB — CBC
HCT: 33.5 % — ABNORMAL LOW (ref 39.0–52.0)
Hemoglobin: 10.5 g/dL — ABNORMAL LOW (ref 13.0–17.0)
MCH: 28.2 pg (ref 26.0–34.0)
MCHC: 31.3 g/dL (ref 30.0–36.0)
MCV: 89.8 fL (ref 78.0–100.0)
PLATELETS: 198 10*3/uL (ref 150–400)
RBC: 3.73 MIL/uL — AB (ref 4.22–5.81)
RDW: 14.3 % (ref 11.5–15.5)
WBC: 5 10*3/uL (ref 4.0–10.5)

## 2017-09-03 LAB — CULTURE, BLOOD (ROUTINE X 2): Special Requests: ADEQUATE

## 2017-09-03 MED ORDER — POTASSIUM CHLORIDE CRYS ER 20 MEQ PO TBCR
20.0000 meq | EXTENDED_RELEASE_TABLET | Freq: Once | ORAL | Status: AC
  Administered 2017-09-03: 20 meq via ORAL
  Filled 2017-09-03: qty 1

## 2017-09-03 MED ORDER — LORAZEPAM 2 MG/ML IJ SOLN
1.0000 mg | INTRAMUSCULAR | Status: DC | PRN
  Administered 2017-09-03: 1 mg via INTRAVENOUS
  Filled 2017-09-03: qty 1

## 2017-09-03 NOTE — Progress Notes (Signed)
Pt arrived to floor, very verbally abusive and trying to kick staff. Pt refusing SCD's, continues to take condom catheter off, and refuses the seizure pads. Every time RN tries to put on bed he takes his right hand and throws them off the bed. Patient is just continuously yelling.   Also every time RN tries to give patient medication by mouth or sips of water, patient is sitting completely upright at a 90 degree angle and continues to choke, he turns almost blue/purple in the face but has managed to cough and clear his throat. RN believes patient is a high risk for aspiration.  MD was made aware, Speech Evaluation is ordered now.
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
## 2017-09-03 NOTE — Progress Notes (Addendum)
[facility]   Reason for visit: Follow up on fever  Interval History: off antibiotics since yesterday; 1/2 blood cultures positive for CoNS; Afebrile now about 24 hours; WBC wnl.  Only says 'home'  Physical Exam: Constitutional: awake, alert Respiratory: Normal respiratory effort; CTA B Cardiovascular: RRR GI: soft, nt, nd Skin: diffuse maculopapular rash, unchanged compared to yesterday MS: left foot with no wound, no cellulitis  Review of Systems: Unable to assess due to being unable to verbalize  Lab Results  Component Value Date   WBC 5.0 09/03/2017   HGB 10.5 (L) 09/03/2017   HCT 33.5 (L) 09/03/2017   MCV 89.8 09/03/2017   PLT 198 09/03/2017    Lab Results  Component Value Date   CREATININE 1.18 09/03/2017   BUN 18 09/03/2017   NA 140 09/03/2017   K 3.4 (L) 09/03/2017   CL 103 09/03/2017   CO2 26 09/03/2017    Lab Results  Component Value Date   ALT 28 08/31/2017   AST 45 (H) 08/31/2017   ALKPHOS 58 08/31/2017     Microbiology: Recent Results (from the past 240 hour(s))  MRSA PCR Screening     Status: Abnormal   Collection Time: 08/31/17 10:26 PM  Result Value Ref Range Status   MRSA by PCR POSITIVE (A) NEGATIVE Final    Comment:        The GeneXpert MRSA Assay (FDA approved for NASAL specimens only), is one component of a comprehensive MRSA colonization surveillance program. It is not intended to diagnose MRSA infection nor to guide or monitor treatment for MRSA infections. RESULT CALLED TO, READ BACK BY AND VERIFIED WITH: K GUIDRY AT 0434 ON 09.30.2018 BY NBROOKS   Urine culture     Status: Abnormal   Collection Time: 09/01/17  3:06 AM  Result Value Ref Range Status   Specimen Description URINE, RANDOM  Final   Special Requests NONE  Final   Culture MULTIPLE SPECIES PRESENT, SUGGEST RECOLLECTION (A)  Final   Report Status 09/02/2017 FINAL  Final  Culture, blood (x 2)     Status: Abnormal   Collection Time:  09/01/17  3:20 PM  Result Value Ref Range Status   Specimen Description BLOOD BLOOD RIGHT HAND  Final   Special Requests IN PEDIATRIC BOTTLE Blood Culture adequate volume  Final   Culture  Setup Time   Final    GRAM POSITIVE COCCI IN CLUSTERS IN PEDIATRIC BOTTLE CRITICAL RESULT CALLED TO, READ BACK BY AND VERIFIED WITH: N GLOGOVAC PHARMD 1926 09/02/17 A BROWNING    Culture (A)  Final    STAPHYLOCOCCUS SPECIES (COAGULASE NEGATIVE) THE SIGNIFICANCE OF ISOLATING THIS ORGANISM FROM A SINGLE SET OF BLOOD CULTURES WHEN MULTIPLE SETS ARE DRAWN IS UNCERTAIN. PLEASE NOTIFY THE MICROBIOLOGY DEPARTMENT WITHIN ONE WEEK IF SPECIATION AND SENSITIVITIES ARE REQUIRED. Performed at [facility], [address] [place], [place] [address]    Report Status 09/03/2017 FINAL  Final  Blood Culture ID Panel (Reflexed)     Status: Abnormal   Collection Time: 09/01/17  3:20 PM  Result Value Ref Range Status   Enterococcus species NOT DETECTED NOT DETECTED Final   Listeria monocytogenes NOT DETECTED NOT DETECTED Final   Staphylococcus species DETECTED (A) NOT DETECTED Final    Comment: Methicillin (oxacillin) resistant coagulase negative staphylococcus. Possible blood culture contaminant (unless isolated from more than one blood culture draw or clinical case suggests pathogenicity). No antibiotic treatment is indicated for blood  culture contaminants.  CRITICAL RESULT CALLED TO, READ BACK BY AND VERIFIED WITH: N GLOGOVAC PHARMD 1926 09/02/17 A BROWNING    Staphylococcus aureus NOT DETECTED NOT DETECTED Final   Methicillin resistance DETECTED (A) NOT DETECTED Final    Comment: CRITICAL RESULT CALLED TO, READ BACK BY AND VERIFIED WITH: N GLOGOVAC PHARMD 1926 09/02/17 A BROWNING    Streptococcus species NOT DETECTED NOT DETECTED Final   Streptococcus agalactiae NOT DETECTED NOT DETECTED Final   Streptococcus pneumoniae NOT DETECTED NOT DETECTED Final   Streptococcus pyogenes NOT DETECTED NOT DETECTED Final    Acinetobacter baumannii NOT DETECTED NOT DETECTED Final   Enterobacteriaceae species NOT DETECTED NOT DETECTED Final   Enterobacter cloacae complex NOT DETECTED NOT DETECTED Final   Escherichia coli NOT DETECTED NOT DETECTED Final   Klebsiella oxytoca NOT DETECTED NOT DETECTED Final   Klebsiella pneumoniae NOT DETECTED NOT DETECTED Final   Proteus species NOT DETECTED NOT DETECTED Final   Serratia marcescens NOT DETECTED NOT DETECTED Final   Haemophilus influenzae NOT DETECTED NOT DETECTED Final   Neisseria meningitidis NOT DETECTED NOT DETECTED Final   Pseudomonas aeruginosa NOT DETECTED NOT DETECTED Final   Candida albicans NOT DETECTED NOT DETECTED Final   Candida glabrata NOT DETECTED NOT DETECTED Final   Candida krusei NOT DETECTED NOT DETECTED Final   Candida parapsilosis NOT DETECTED NOT DETECTED Final   Candida tropicalis NOT DETECTED NOT DETECTED Final    Comment: Performed at [facility], [address] [place], [place] [address]  Culture, blood (x 2)     Status: None (Preliminary result)   Collection Time: 09/01/17  3:55 PM  Result Value Ref Range Status   Specimen Description BLOOD RIGHT WRIST  Final   Special Requests   Final    Blood Culture results may not be optimal due to an excessive volume of blood received in culture bottles   Culture   Final    NO GROWTH < 24 HOURS Performed at [facility], [address], [place], [place] [address]    Report Status PENDING  Incomplete    Impression/Plan:  1. Fever - no fever now.  Does not appear to be associated with bacterial infection. No antibiotics indicated.  Continue to observe off of antibiotics  2. Positive blood culture - 1/2.  Most c/w contaminate.  No treatment indicated.

## 2017-09-04 DIAGNOSIS — E876 Hypokalemia: Secondary | ICD-10-CM

## 2017-09-04 DIAGNOSIS — R404 Transient alteration of awareness: Secondary | ICD-10-CM

## 2017-09-04 LAB — CBC
HCT: 36.3 % — ABNORMAL LOW (ref 39.0–52.0)
Hemoglobin: 11.7 g/dL — ABNORMAL LOW (ref 13.0–17.0)
MCH: 29 pg (ref 26.0–34.0)
MCHC: 32.2 g/dL (ref 30.0–36.0)
MCV: 89.9 fL (ref 78.0–100.0)
PLATELETS: 238 10*3/uL (ref 150–400)
RBC: 4.04 MIL/uL — ABNORMAL LOW (ref 4.22–5.81)
RDW: 14 % (ref 11.5–15.5)
WBC: 6.5 10*3/uL (ref 4.0–10.5)

## 2017-09-04 LAB — BASIC METABOLIC PANEL
Anion gap: 10 (ref 5–15)
BUN: 14 mg/dL (ref 6–20)
CALCIUM: 8.7 mg/dL — AB (ref 8.9–10.3)
CO2: 29 mmol/L (ref 22–32)
Chloride: 102 mmol/L (ref 101–111)
Creatinine, Ser: 0.85 mg/dL (ref 0.61–1.24)
GFR calc Af Amer: >60 mL/min (ref >60)
GLUCOSE: 148 mg/dL — AB (ref 65–99)
Potassium: 3.8 mmol/L (ref 3.5–5.1)
SODIUM: 141 mmol/L (ref 135–145)

## 2017-09-04 LAB — GLUCOSE, CAPILLARY
Glucose-Capillary: 127 mg/dL — ABNORMAL HIGH (ref 65–99)
Glucose-Capillary: 177 mg/dL — ABNORMAL HIGH (ref 65–99)
Glucose-Capillary: 144 mg/dL — ABNORMAL HIGH (ref 65–99)
Glucose-Capillary: 140 mg/dL — ABNORMAL HIGH (ref 65–99)

## 2017-09-04 NOTE — Progress Notes (Signed)
[facility]   Reason for visit: Follow up on fever  Interval History: off antibiotics since ; 1/2 blood cultures positive for CoNS; Afebrile now over 48 hours; WBC wnl.  Only says 'home'  Physical Exam: Constitutional: awake, alert Respiratory: Normal respiratory effort; CTA B Cardiovascular: RRR GI: soft, nt, nd Skin: rash improved   Review of Systems: Unable to assess due to being unable to verbalize  Lab Results  Component Value Date   WBC 6.5 09/04/2017   HGB 11.7 (L) 09/04/2017   HCT 36.3 (L) 09/04/2017   MCV 89.9 09/04/2017   PLT 238 09/04/2017    Lab Results  Component Value Date   CREATININE 0.85 09/04/2017   BUN 14 09/04/2017   NA 141 09/04/2017   K 3.8 09/04/2017   CL 102 09/04/2017   CO2 29 09/04/2017    Lab Results  Component Value Date   ALT 28 08/31/2017   AST 45 (H) 08/31/2017   ALKPHOS 58 08/31/2017     Microbiology: Recent Results (from the past 240 hour(s))  MRSA PCR Screening     Status: Abnormal   Collection Time: 08/31/17 10:26 PM  Result Value Ref Range Status   MRSA by PCR POSITIVE (A) NEGATIVE Final    Comment:        The GeneXpert MRSA Assay (FDA approved for NASAL specimens only), is one component of a comprehensive MRSA colonization surveillance program. It is not intended to diagnose MRSA infection nor to guide or monitor treatment for MRSA infections. RESULT CALLED TO, READ BACK BY AND VERIFIED WITH: K GUIDRY AT 0434 ON 09.30.2018 BY NBROOKS   Urine culture     Status: Abnormal   Collection Time: 09/01/17  3:06 AM  Result Value Ref Range Status   Specimen Description URINE, RANDOM  Final   Special Requests NONE  Final   Culture MULTIPLE SPECIES PRESENT, SUGGEST RECOLLECTION (A)  Final   Report Status 09/02/2017 FINAL  Final  Culture, blood (x 2)     Status: Abnormal   Collection Time: 09/01/17  3:20 PM  Result Value Ref Range Status   Specimen Description BLOOD BLOOD RIGHT HAND  Final   Special Requests IN PEDIATRIC BOTTLE Blood Culture adequate volume  Final   Culture  Setup Time   Final    GRAM POSITIVE COCCI IN CLUSTERS IN PEDIATRIC BOTTLE CRITICAL RESULT CALLED TO, READ BACK BY AND VERIFIED WITH: N GLOGOVAC PHARMD 1926 09/02/17 A BROWNING    Culture (A)  Final    STAPHYLOCOCCUS SPECIES (COAGULASE NEGATIVE) THE SIGNIFICANCE OF ISOLATING THIS ORGANISM FROM A SINGLE SET OF BLOOD CULTURES WHEN MULTIPLE SETS ARE DRAWN IS UNCERTAIN. PLEASE NOTIFY THE MICROBIOLOGY DEPARTMENT WITHIN ONE WEEK IF SPECIATION AND SENSITIVITIES ARE REQUIRED. Performed at [facility], [address], [place], [place] [address]    Report Status 09/03/2017 FINAL  Final  Blood Culture ID Panel (Reflexed)     Status: Abnormal   Collection Time: 09/01/17  3:20 PM  Result Value Ref Range Status   Enterococcus species NOT DETECTED NOT DETECTED Final   Listeria monocytogenes NOT DETECTED NOT DETECTED Final   Staphylococcus species DETECTED (A) NOT DETECTED Final    Comment: Methicillin (oxacillin) resistant coagulase negative staphylococcus. Possible blood culture contaminant (unless isolated from more than one blood culture draw or clinical case suggests pathogenicity). No antibiotic treatment is indicated for blood  culture contaminants. CRITICAL RESULT CALLED TO, READ BACK BY AND VERIFIED WITH: N GLOGOVAC PHARMD 1926  09/02/17 A BROWNING    Staphylococcus aureus NOT DETECTED NOT DETECTED Final   Methicillin resistance DETECTED (A) NOT DETECTED Final    Comment: CRITICAL RESULT CALLED TO, READ BACK BY AND VERIFIED WITH: N GLOGOVAC PHARMD 1926 09/02/17 A BROWNING    Streptococcus species NOT DETECTED NOT DETECTED Final   Streptococcus agalactiae NOT DETECTED NOT DETECTED Final   Streptococcus pneumoniae NOT DETECTED NOT DETECTED Final   Streptococcus pyogenes NOT DETECTED NOT DETECTED Final   Acinetobacter baumannii NOT DETECTED NOT DETECTED Final   Enterobacteriaceae species NOT DETECTED NOT  DETECTED Final   Enterobacter cloacae complex NOT DETECTED NOT DETECTED Final   Escherichia coli NOT DETECTED NOT DETECTED Final   Klebsiella oxytoca NOT DETECTED NOT DETECTED Final   Klebsiella pneumoniae NOT DETECTED NOT DETECTED Final   Proteus species NOT DETECTED NOT DETECTED Final   Serratia marcescens NOT DETECTED NOT DETECTED Final   Haemophilus influenzae NOT DETECTED NOT DETECTED Final   Neisseria meningitidis NOT DETECTED NOT DETECTED Final   Pseudomonas aeruginosa NOT DETECTED NOT DETECTED Final   Candida albicans NOT DETECTED NOT DETECTED Final   Candida glabrata NOT DETECTED NOT DETECTED Final   Candida krusei NOT DETECTED NOT DETECTED Final   Candida parapsilosis NOT DETECTED NOT DETECTED Final   Candida tropicalis NOT DETECTED NOT DETECTED Final    Comment: Performed at [facility], [address] [place], [place] [address]  Culture, blood (x 2)     Status: None (Preliminary result)   Collection Time: 09/01/17  3:55 PM  Result Value Ref Range Status   Specimen Description BLOOD RIGHT WRIST  Final   Special Requests   Final    Blood Culture results may not be optimal due to an excessive volume of blood received in culture bottles   Culture   Final    NO GROWTH 2 DAYS Performed at [facility], [address], [place], [place] [address]    Report Status PENDING  Incomplete    Impression/Plan:  1. Fever - continues to be afebrile.  Does not appear to be associated with bacterial infection. No antibiotics indicated.  Continue to observe off of antibiotics  2. Positive blood culture - 1/2.  Most c/w contaminate. Other blood culture remains negative.    I will sign off, please call with any questions.
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
## 2017-09-04 NOTE — Care Management Important Message (Addendum)
Important Message  Patient Details IM Letter given to Cookie/Case Manager to present to Patient Name: Wesley Cohen MRN: [id number] Date of Birth: 23-Dec-1965   Medicare Important Message Given:  Yes    [name] 09/04/2017, 12:08 PMImportant Message  Patient Details  Name: Wesley Cohen MRN: [id number] Date of Birth: May 19, 1966   Medicare Important Message Given:  Yes    [name] 09/04/2017, 12:08 PM

## 2017-09-04 NOTE — Plan of Care (Signed)
Problem: Education: Goal: Knowledge of [facility] Education information/materials will improve Outcome: Not Met (add Reason) Pt verbally aggressive w/ staff, unreceptive to teaching. Frequently yells "F### you!" to staff repeatedly.   Problem: Skin Integrity: Goal: Risk for impaired skin integrity will decrease Outcome: Progressing Skin intact, pt turns himself and moves about in the bed frequently. Has excoriated and macerated abdominal/groin folds. Areas cleansed and well dried.   Problem: Activity: Goal: Risk for activity intolerance will decrease Outcome: Progressing PT following.   Problem: Nutrition: Goal: Adequate nutrition will be maintained Outcome: Completed/Met Date Met: 09/04/17 Pt w/ great appetite. EAts greater than 50% of meals.
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
## 2017-09-05 DIAGNOSIS — W19XXXA Unspecified fall, initial encounter: Secondary | ICD-10-CM

## 2017-09-05 DIAGNOSIS — R627 Adult failure to thrive: Secondary | ICD-10-CM

## 2017-09-05 DIAGNOSIS — Z515 Encounter for palliative care: Secondary | ICD-10-CM

## 2017-09-05 DIAGNOSIS — F0151 Vascular dementia with behavioral disturbance: Secondary | ICD-10-CM

## 2017-09-05 DIAGNOSIS — Z8673 Personal history of transient ischemic attack (TIA), and cerebral infarction without residual deficits: Secondary | ICD-10-CM

## 2017-09-05 DIAGNOSIS — Z7189 Other specified counseling: Secondary | ICD-10-CM

## 2017-09-05 DIAGNOSIS — R1310 Dysphagia, unspecified: Secondary | ICD-10-CM

## 2017-09-05 DIAGNOSIS — R451 Restlessness and agitation: Secondary | ICD-10-CM

## 2017-09-05 LAB — GLUCOSE, CAPILLARY
GLUCOSE-CAPILLARY: 163 mg/dL — AB (ref 65–99)
Glucose-Capillary: 163 mg/dL — ABNORMAL HIGH (ref 65–99)
Glucose-Capillary: 162 mg/dL — ABNORMAL HIGH (ref 65–99)
Glucose-Capillary: 136 mg/dL — ABNORMAL HIGH (ref 65–99)

## 2017-09-05 MED ORDER — OLANZAPINE 5 MG PO TABS
7.5000 mg | ORAL_TABLET | Freq: Two times a day (BID) | ORAL | Status: DC
  Administered 2017-09-05: 7.5 mg via ORAL
  Filled 2017-09-05: qty 2

## 2017-09-05 MED ORDER — FLUOXETINE HCL 20 MG PO CAPS: 40.0000 mg | ORAL_CAPSULE | Freq: Every day | ORAL | Status: DC

## 2017-09-05 MED ORDER — FLUOXETINE HCL 20 MG PO CAPS: 40.0000 mg | ORAL_CAPSULE | Freq: Every day | ORAL | 3 refills | Status: AC

## 2017-09-05 MED ORDER — MUPIROCIN 2 % EX OINT: 1.0000 "application " | TOPICAL_OINTMENT | Freq: Two times a day (BID) | CUTANEOUS | 0 refills | Status: AC

## 2017-09-05 MED ORDER — METOPROLOL TARTRATE 25 MG PO TABS
37.5000 mg | ORAL_TABLET | Freq: Every day | ORAL | Status: DC
  Administered 2017-09-05: 37.5 mg via ORAL
  Filled 2017-09-05: qty 2

## 2017-09-05 MED ORDER — CLONAZEPAM 1 MG PO TABS: 1.0000 mg | ORAL_TABLET | Freq: Two times a day (BID) | ORAL | 0 refills | Status: AC

## 2017-09-05 MED ORDER — HYDRALAZINE HCL 20 MG/ML IJ SOLN: 5.0000 mg | Freq: Once | INTRAMUSCULAR | Status: DC

## 2017-09-05 MED ORDER — OLANZAPINE 7.5 MG PO TABS: 7.5000 mg | ORAL_TABLET | Freq: Two times a day (BID) | ORAL | 0 refills | Status: AC
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
## 2017-09-05 NOTE — Progress Notes (Signed)
Pt returning to [facility] at DC. From there family and facility to investigate long term placement options closer to pt's family's home in [place].  Pt's room 307-B, report # [phone] ask for 300 hall RN  All information provided to facility via the H[facility]. Pt will transport via [facility]- completed medical necessity form and arranged transportation.  Pt's family at bedside and agreeable to plan.  Ilean Skill, MSW, LCSW Clinical Social Work 09/05/2017 [phone]
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
## 2017-09-05 NOTE — [place] FL2 (Signed)
[place] MEDICAID FL2 LEVEL OF CARE SCREENING TOOL     IDENTIFICATION  Patient Name: Wesley Cohen Birthdate: 1966-10-25 Sex: male Admission Date (Current Location): 08/31/2017  [facility] and [place] Number:  Producer, television/film/video and Address:  [facility],  [address] [place] [address]      Provider Number: [id number]  Attending Physician Name and Address:  Kathlen Mody, MD  Relative Name and Phone Number:       Current Level of Care: Hospital Recommended Level of Care: Skilled Nursing Facility Prior Approval Number:    Date Approved/Denied:   PASRR Number: [id number]  Discharge Plan: SNF    Current Diagnoses: Patient Active Problem List   Diagnosis Date Noted  . Palliative care by specialist   . DNR (do not resuscitate) discussion   . Fall   . Altered mental status 08/31/2017    Orientation RESPIRATION BLADDER Height & Weight     Self, Place  Normal External catheter, Incontinent Weight: 190 lb 7.6 oz (86.4 kg) Height:   (170.2 cm)  BEHAVIORAL SYMPTOMS/MOOD NEUROLOGICAL BOWEL NUTRITION STATUS  Verbally abusive   Incontinent Diet (regular diet)  AMBULATORY STATUS COMMUNICATION OF NEEDS Skin   Extensive Assist Verbally Normal                       Personal Care Assistance Level of Assistance  Bathing, Feeding, Dressing Bathing Assistance: Maximum assistance Feeding assistance: Limited assistance Dressing Assistance: Maximum assistance     Functional Limitations Info  Sight, Hearing, Speech Sight Info: Adequate Hearing Info: Adequate Speech Info: Adequate    SPECIAL CARE FACTORS FREQUENCY                       Contractures Contractures Info: Not present    Additional Factors Info  Code Status, Allergies, Isolation Precautions Code Status Info: full code Allergies Info:  Penicillins, Hydrocodone, Morphine And Related, Oxycodone, Tylenol Acetaminophen     Isolation Precautions Info: contact precautions-  MRSA     Current Medications (09/05/2017):  This is the current hospital active medication list Current Facility-Administered Medications  Medication Dose Route Frequency Provider Last Rate Last Dose  . amantadine (SYMMETREL) capsule 100 mg  100 mg Oral BID Dorothea Ogle, MD   100 mg at 09/05/17 0900  . aspirin EC tablet 81 mg  81 mg Oral Daily Dorothea Ogle, MD   81 mg at 09/05/17 0901  . atorvastatin (LIPITOR) tablet 20 mg  20 mg Oral QHS Dorothea Ogle, MD   20 mg at 09/04/17 2314  . baclofen (LIORESAL) tablet 5 mg  5 mg Oral TID Dorothea Ogle, MD   5 mg at 09/05/17 0901  . carbamazepine (TEGRETOL) tablet 200 mg  200 mg Oral BID Dorothea Ogle, MD   200 mg at 09/05/17 0900  . celecoxib (CELEBREX) capsule 100 mg  100 mg Oral BID Dorothea Ogle, MD   100 mg at 09/05/17 0900  . Chlorhexidine Gluconate Cloth 2 % PADS 6 each  6 each Topical Q0600 Dorothea Ogle, MD   6 each at 09/05/17 0600  . clonazePAM (KLONOPIN) tablet 1 mg  1 mg Oral BID Dorothea Ogle, MD   1 mg at 09/05/17 0901  . clopidogrel (PLAVIX) tablet 75 mg  75 mg Oral Daily Dorothea Ogle, MD   75 mg at 09/05/17 0901  . diphenhydrAMINE (BENADRYL) capsule 25 mg  25 mg Oral Q4H PRN  Dorothea Ogle, MD   25 mg at 09/05/17 0981  . divalproex (DEPAKOTE ER) 24 hr tablet 1,000 mg  1,000 mg Oral BID Dorothea Ogle, MD   1,000 mg at 09/05/17 0901  . docusate sodium (COLACE) capsule 100 mg  100 mg Oral BID Dorothea Ogle, MD   100 mg at 09/05/17 0900  . enoxaparin (LOVENOX) injection 40 mg  40 mg Subcutaneous QHS Dorothea Ogle, MD   40 mg at 09/04/17 2322  . FLUoxetine (PROZAC) capsule 20 mg  20 mg Oral Daily Dorothea Ogle, MD   20 mg at 09/05/17 0901  . gabapentin (NEURONTIN) capsule 800 mg  800 mg Oral TID Dorothea Ogle, MD   800 mg at 09/05/17 0901  . hydrocerin (EUCERIN) cream   Topical BID Dorothea Ogle, MD      . ibuprofen (ADVIL,MOTRIN) tablet 400 mg  400 mg Oral Q6H PRN Dorothea Ogle, MD   400 mg at 09/02/17 1122  . insulin  aspart (novoLOG) injection 0-9 Units  0-9 Units Subcutaneous TID WC Dorothea Ogle, MD   2 Units at 09/05/17 1250  . LORazepam (ATIVAN) injection 1 mg  1 mg Intravenous Q4H PRN Dorothea Ogle, MD   1 mg at 09/03/17 1823  . metFORMIN (GLUCOPHAGE) tablet 500 mg  500 mg Oral QAC breakfast Dorothea Ogle, MD   500 mg at 09/05/17 0900  . metoprolol tartrate (LOPRESSOR) tablet 37.5 mg  37.5 mg Oral QHS Dorothea Ogle, MD   37.5 mg at 09/04/17 2315  . mupirocin ointment (BACTROBAN) 2 % 1 application  1 application Nasal BID Dorothea Ogle, MD   1 application at 09/05/17 0901  . OLANZapine (ZYPREXA) tablet 10 mg  10 mg Oral QHS Dorothea Ogle, MD   10 mg at 09/04/17 2315  . ondansetron (ZOFRAN) tablet 4 mg  4 mg Oral Q6H PRN Dorothea Ogle, MD       Or  . ondansetron [facility]) injection 4 mg  4 mg Intravenous Q6H PRN Dorothea Ogle, MD      . pantoprazole (PROTONIX) EC tablet 40 mg  40 mg Oral Daily Dorothea Ogle, MD   40 mg at 09/05/17 0900  . senna (SENOKOT) tablet 17.2 mg  17.2 mg Oral BID Dorothea Ogle, MD   17.2 mg at 09/05/17 0900  . sodium chloride flush (NS) 0.9 % injection 3 mL  3 mL Intravenous Q12H Dorothea Ogle, MD   3 mL at 09/03/17 2134  . tamsulosin (FLOMAX) capsule 0.4 mg  0.4 mg Oral QHS Dorothea Ogle, MD   0.4 mg at 09/04/17 2317     Discharge Medications: Please see discharge summary for a list of discharge medications.  Relevant Imaging Results:  Relevant Lab Results:   Additional Information SS#[id number]  Nelwyn Salisbury, LCSW

## 2017-09-05 NOTE — Progress Notes (Signed)
Patient ID: Wesley Cohen, male   DOB: Oct 17, 1966, 51 y.o.   MRN: [id number]  This NP visited patient at the bedside as a follow up to  Initial  GOCs meeting and to meet family for further discussion regarding diagnosis, prognosis, long-term goals of care.  Patient clearly is happy to see his family is smiling and laughing.  We discussed the patient's multiple comorbidities as it relates to high risk for decompensation and long-term poor prognosis.  We discussed risk of aspiration and high risk for recurrent pneumonias.  We discussed high risk for skin breakdown.  We discussed high risk for urinary tract infections.  Questions and concerns were addressed.  At this time all family are in agreement that they are open to all offered and available medical interventions to prolong life for this patient.  Discussed with patient the importance of continued conversation with family and their  medical providers regarding overall plan of care and treatment options,  ensuring decisions are within the context of the patient's values and GOCs.  Time in  1500         Time out    1600   total time spent on the unit was 60 minutes  Discussed with Dr. Blake Divine    Plan is for discharge back to skilled nursing facility today  Greater than 50% of the time was spent in counseling and coordination of care  Lorinda Creed NP  Palliative Medicine Team Team Phone # [phone] Pager [phone]

## 2017-09-05 NOTE — Progress Notes (Signed)
BP in the 140s called [facility] back to add patient on the transportation list. Have tried to call several times to give report, unable to speak to anyone at [facility] facility.

## 2017-09-05 NOTE — Progress Notes (Signed)
EMS here to pick-up patient and stated his BP is high in the 160s and will not transport patient, to call [facility] back when BP is lower.. MD notified and metoprolol was ordered to give early. Will continue to monitor patient.
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
## 2017-09-06 LAB — CULTURE, BLOOD (ROUTINE X 2): Culture: NO GROWTH

## 2017-09-06 NOTE — Progress Notes (Signed)
[facility] arrived to transport pt. To SNF. SNF contacted to make sure pt. Is able to be transferred back to the facility. VS stable when taken. No IV access.

## 2017-09-07 LAB — GLUCOSE, CAPILLARY: GLUCOSE-CAPILLARY: 156 mg/dL — AB (ref 65–99)

## 2017-09-16 ENCOUNTER — Encounter (HOSPITAL_COMMUNITY): Payer: Self-pay

## 2017-09-16 ENCOUNTER — Emergency Department (HOSPITAL_COMMUNITY): Payer: Medicare Other

## 2017-09-16 ENCOUNTER — Emergency Department (HOSPITAL_COMMUNITY)
Admission: EM | Admit: 2017-09-16 | Discharge: 2017-09-16 | Disposition: A | Discharge status: Home / Self Care | Payer: Medicare Other | Attending: Emergency Medicine | Admitting: Emergency Medicine

## 2017-09-16 DIAGNOSIS — Y999 Unspecified external cause status: Secondary | ICD-10-CM | POA: Diagnosis not present

## 2017-09-16 DIAGNOSIS — Z79899 Other long term (current) drug therapy: Secondary | ICD-10-CM | POA: Insufficient documentation

## 2017-09-16 DIAGNOSIS — W06XXXA Fall from bed, initial encounter: Secondary | ICD-10-CM | POA: Diagnosis not present

## 2017-09-16 DIAGNOSIS — Y9289 Other specified places as the place of occurrence of the external cause: Secondary | ICD-10-CM | POA: Insufficient documentation

## 2017-09-16 DIAGNOSIS — Z23 Encounter for immunization: Secondary | ICD-10-CM | POA: Insufficient documentation

## 2017-09-16 DIAGNOSIS — S0990XA Unspecified injury of head, initial encounter: Secondary | ICD-10-CM | POA: Diagnosis present

## 2017-09-16 DIAGNOSIS — Z7902 Long term (current) use of antithrombotics/antiplatelets: Secondary | ICD-10-CM | POA: Diagnosis not present

## 2017-09-16 DIAGNOSIS — E119 Type 2 diabetes mellitus without complications: Secondary | ICD-10-CM | POA: Diagnosis not present

## 2017-09-16 DIAGNOSIS — Y9384 Activity, sleeping: Secondary | ICD-10-CM | POA: Insufficient documentation

## 2017-09-16 DIAGNOSIS — Z7984 Long term (current) use of oral hypoglycemic drugs: Secondary | ICD-10-CM | POA: Diagnosis not present

## 2017-09-16 DIAGNOSIS — S0101XA Laceration without foreign body of scalp, initial encounter: Secondary | ICD-10-CM | POA: Diagnosis not present

## 2017-09-16 DIAGNOSIS — Z7982 Long term (current) use of aspirin: Secondary | ICD-10-CM | POA: Diagnosis not present

## 2017-09-16 LAB — CBG MONITORING, ED: GLUCOSE-CAPILLARY: 163 mg/dL — AB (ref 65–99)

## 2017-09-16 MED ORDER — LIDOCAINE-EPINEPHRINE (PF) 2 %-1:200000 IJ SOLN
5.0000 mL | Freq: Once | INTRAMUSCULAR | Status: AC
  Administered 2017-09-16: 5 mL
  Filled 2017-09-16: qty 20

## 2017-09-16 MED ORDER — TETANUS-DIPHTH-ACELL PERTUSSIS 5-2.5-18.5 LF-MCG/0.5 IM SUSP
0.5000 mL | Freq: Once | INTRAMUSCULAR | Status: AC
  Administered 2017-09-16: 0.5 mL via INTRAMUSCULAR
  Filled 2017-09-16: qty 0.5

## 2017-09-16 NOTE — ED Notes (Addendum)
Pt finished eating 3/4 of a sandwich on initial assessment. Per Asher Muir, RN pt able to eat sandwich with assistance. Mallory NT assisting pt at this time.

## 2017-09-16 NOTE — ED Notes (Addendum)
[facility] called by Shirlee Latch.

## 2017-09-16 NOTE — ED Notes (Signed)
Pt finished eating his sandwich. No coughing, difficulty swallowing, or wet sound noted to pt voice. NAD noted.

## 2017-09-16 NOTE — Discharge Instructions (Signed)
Please read instructions below.  Your CT scan of your head showed no new findings. Keep your wound clean and covered. After 24 hours, you can gently wash it with soap and water, then pat it dry and recover. Follow up with your primary care or urgent care for wound recheck in 3 days.  Return to the ER for fever, pus draining from wound, redness, or new or worsening symptoms.

## 2017-09-16 NOTE — ED Notes (Signed)
Pt reports missing red foam hand roll after returning from CT, called CT, they deny seeing it

## 2017-09-16 NOTE — ED Notes (Signed)
Attempted to call report x 2 to [facility], unable to reach due to busy signal. Will continue to try.

## 2017-09-16 NOTE — ED Notes (Signed)
Patient denies pain and is resting comfortably.  

## 2017-09-16 NOTE — ED Notes (Signed)
[facility] here for pt transport. Report given. All dc paperwork given to staff. Report called to [facility].

## 2017-09-16 NOTE — ED Notes (Signed)
Attempted to call report to [facility]. No response. Will try again later.

## 2017-09-16 NOTE — ED Provider Notes (Signed)
[facility]-EMERGENCY DEPT Provider Note   CSN: [id number] Arrival date & time: 09/16/17  1142     History   Chief Complaint Chief Complaint  Patient presents with  . Fall  . Head Laceration    HPI Wesley Cohen is a 51 y.o. male w PMHx DM, seizures, stroke w residua left-sided weakness and aphasia, traumatic subdural hematoma, DVT, anxiety, HTN, presenting to the ED from [facility] with head laceration that occurred 2 days ago s/p fall out of bed. He was sent to ED today because wound would not stop bleeding. Spoke with Cala Bradford, Interior and spatial designer of nursing, who states pt was found on floor Saturday PM after "rolling out of bed." She states they spoke with a physician who ordered frequent neuro checks. She states he remained at his baseline, however bleeding continued so he was sent here. She denies any other changes in health, no new reported signs or symptoms. He is currently taking plavix. Patient able to answer yes or no questions.     Past Medical History:  Diagnosis Date  . Anxiety   . Cholecystitis   . Constipation   . Diabetes mellitus without complication (HCC)   . DVT (deep vein thrombosis) in pregnancy (HCC)   . Gastroparesis   . Hyperlipidemia   . Hypertension   . Peripheral neuropathy   . Peripheral vascular disease (HCC)   . Psoriasis   . Seizures (HCC)   . Stroke (HCC)   . Urinary retention     Patient Active Problem List   Diagnosis Date Noted  . Palliative care by specialist   . DNR (do not resuscitate) discussion   . Fall   . Adult failure to thrive   . Altered mental status 08/31/2017    No past surgical history on file.     Home Medications    Prior to Admission medications   Medication Sig Start Date End Date Taking? Authorizing Provider  amantadine (SYMMETREL) 100 MG capsule Take 100 mg by mouth 2 (two) times daily. 08/10/17  Yes [provider]  aspirin EC 81 MG tablet Take 81 mg by mouth daily.   Yes [provider]  atorvastatin (LIPITOR) 20 MG tablet Take 20 mg by mouth at bedtime. 08/24/17  Yes [provider]  baclofen (LIORESAL) 10 MG tablet Take 5 mg by mouth 3 (three) times daily. 08/24/17  Yes [provider]  carbamazepine (TEGRETOL) 200 MG tablet Take 200 mg by mouth 2 (two) times daily. 08/24/17  Yes [provider]  celecoxib (CELEBREX) 100 MG capsule Take 100 mg by mouth 2 (two) times daily. 08/10/17  Yes [provider]  clindamycin (CLEOCIN) 300 MG capsule Take 300 mg by mouth 2 (two) times daily. Ordered 09/15/17: 10 day course for cellulitis   Yes [provider]  clonazePAM (KLONOPIN) 1 MG tablet Take 1 tablet (1 mg total) by mouth 2 (two) times daily. 09/05/17  Yes Kathlen Mody, MD  clopidogrel (PLAVIX) 75 MG tablet Take 75 mg by mouth daily. 08/07/17  Yes [provider]  diphenhydrAMINE (BENADRYL) 25 mg capsule Take 25 mg by mouth every 6 (six) hours as needed for itching.   Yes [provider]  divalproex (DEPAKOTE ER) 500 MG 24 hr tablet Take 1,000 mg by mouth 2 (two) times daily. 08/07/17  Yes [provider]  docusate sodium (COLACE) 100 MG capsule Take 100 mg by mouth 2 (two) times daily.   Yes [provider]  FLUoxetine (PROZAC) 20  MG tablet Take 20 mg by mouth daily.   Yes [provider]  gabapentin (NEURONTIN) 800 MG tablet Take 800 mg by mouth 3 (three) times daily. 08/30/17  Yes [provider]  Melatonin 5 MG TABS Take 10 mg by mouth at bedtime.   Yes [provider]  metFORMIN (GLUCOPHAGE) 500 MG tablet Take 500 mg by mouth daily. 08/11/17  Yes [provider]  metoprolol tartrate (LOPRESSOR) 25 MG tablet Take 37.5 mg by mouth at bedtime. Hold for SBP <110, DBP <55, HR <60 08/30/17  Yes [provider]  Multiple Vitamins-Minerals (CERTAVITE/ANTIOXIDANTS) TABS Take 1 tablet by mouth daily.   Yes [provider]  mupirocin ointment (BACTROBAN)  2 % Place 1 application into the nose 2 (two) times daily. 09/05/17  Yes Kathlen Mody, MD  OLANZapine (ZYPREXA) 10 MG tablet Take 10 mg by mouth at bedtime.   Yes [provider]  omeprazole (PRILOSEC) 20 MG capsule Take 20 mg by mouth daily. 08/25/17  Yes [provider]  ondansetron (ZOFRAN) 4 MG tablet Take 4 mg by mouth every 6 (six) hours as needed for nausea or vomiting.  07/10/17  Yes [provider]  senna (SENOKOT) 8.6 MG TABS tablet Take 17.2 mg by mouth 2 (two) times daily.   Yes [provider]  Skin Protectants, Misc. (EUCERIN) cream Apply 1 application topically 2 (two) times daily. To dry area over face   Yes [provider]  tamsulosin (FLOMAX) 0.4 MG CAPS capsule Take 0.4 mg by mouth at bedtime. 07/28/17  Yes [provider]  FLUoxetine (PROZAC) 20 MG capsule Take 2 capsules (40 mg total) by mouth daily. Patient not taking: Reported on 09/16/2017 09/05/17   Kathlen Mody, MD  OLANZapine (ZYPREXA) 7.5 MG tablet Take 1 tablet (7.5 mg total) by mouth 2 (two) times daily. Patient not taking: Reported on 09/16/2017 09/05/17   Kathlen Mody, MD    Family History No family history on file.  Social History Social History  Substance Use Topics  . Smoking status: Never Smoker  . Smokeless tobacco: Never Used  . Alcohol use No     Allergies   Codeine; Edetic acid; Penicillins; Yellow dye; Hydrocodone; Morphine and related; Oxycodone; and Tylenol [acetaminophen]   Review of Systems Review of Systems  HENT: Negative for facial swelling.   Eyes: Negative for visual disturbance.  Cardiovascular: Negative for chest pain.  Gastrointestinal: Negative for abdominal pain and nausea.  Musculoskeletal: Negative for back pain and neck pain.  Skin: Positive for wound (left brow).  Neurological: Negative for headaches.  All other systems reviewed and are negative.    Physical Exam Updated Vital Signs BP (!) 186/91   Pulse 65   Resp  16   Ht  (1.702 m)   Wt 86.2 kg (190 lb)   SpO2 95%   BMI 29.76 kg/m   Physical Exam  Constitutional: He appears well-developed and well-nourished. No distress.  Patient following commands and answering yes or no questions. Patient very difficult to understand, however able to spell some words that he is trying to express. He is able to tell me if he has pain in where, saying that his head hurts over his left eyebrow.  HENT:  Head: Normocephalic and atraumatic.  2 mm bleeding laceration to left brow, mild tenderness surrounding wound. No significant hematoma. No crepitus. Scalp without hematoma.  Eyes: Pupils are equal, round, and reactive to light. Conjunctivae and EOM are normal.  Neck: Normal range of  motion. Neck supple.  Cardiovascular: Normal rate, regular rhythm, normal heart sounds and intact distal pulses.   Pulmonary/Chest: Effort normal and breath sounds normal. No respiratory distress. He has no wheezes. He has no rales. He exhibits no tenderness.  Abdominal: Soft. Bowel sounds are normal. He exhibits no distension. There is no tenderness. There is no rebound and no guarding.  Musculoskeletal:  Left upper extremity is contracted. Extremities without obvious injury, deformities, or edema. No spinal or paraspinal tenderness, no bony step-offs or gross deformities.  Neurological: He is alert.  Unable to assess neuro exam as patient with residual left-sided weakness and facial droop.   Skin: Skin is warm.  Psychiatric: He has a normal mood and affect. His behavior is normal.  Nursing note and vitals reviewed.    ED Treatments / Results  Labs (all labs ordered are listed, but only abnormal results are displayed) Labs Reviewed  CBG MONITORING, ED - Abnormal; Notable for the following:       Result Value   Glucose-Capillary 163 (*)    All other components within normal limits    EKG  EKG Interpretation None       Radiology Ct Head Wo Contrast  Result Date:  09/16/2017 CLINICAL DATA:  Fall 2 days ago. EXAM: CT HEAD WITHOUT CONTRAST TECHNIQUE: Contiguous axial images were obtained from the base of the skull through the vertex without intravenous contrast. COMPARISON:  CT head dated August 31, 2017. FINDINGS: Brain: No evidence of acute infarction, hemorrhage, hydrocephalus, extra-axial collection or mass lesion/mass effect. Old right frontoparietal and left frontal infarcts are again noted. Corresponding ex vacuo dilatation of the lateral ventricles is unchanged. Vascular: Atherosclerotic vascular calcification of the carotid siphons. No hyperdense vessel. Skull: Left parietal craniotomy defect again noted. No acute fracture or focal lesion. Sinuses/Orbits: The bilateral paranasal sinuses and mastoid air cells are clear. The orbits are unremarkable. Other: None. IMPRESSION: No acute intracranial abnormality. Unchanged right frontoparietal and left frontal encephalomalacia. Electronically Signed   By: Obie Dredge M.D.   On: 09/16/2017 14:13    Procedures .Marland KitchenLaceration Repair Date/Time: 09/16/2017 6:41 PM Performed by: RUSSO, [place] N Authorized by: RUSSO, [place] N   Consent:    Consent obtained:  Verbal (patient nodded and said okay after describing procedure)   Consent given by:  Patient   Risks discussed:  Pain and poor cosmetic result   Alternatives discussed:  No treatment Anesthesia (see MAR for exact dosages):    Anesthesia method:  Local infiltration   Local anesthetic:  Lidocaine 2% WITH epi Laceration details:    Location:  Face   Face location:  L eyebrow   Length (cm):  0.2 (2mm) Repair type:    Repair type:  Simple Pre-procedure details:    Preparation:  Patient was prepped and draped in usual sterile fashion Exploration:    Hemostasis achieved with:  Epinephrine   Wound exploration: wound explored through full range of motion     Wound extent: no foreign bodies/material noted and no underlying fracture noted      Contaminated: no   Treatment:    Area cleansed with:  Saline   Amount of cleaning:  Standard   Irrigation solution:  Sterile saline   Irrigation method:  Syringe   Visualized foreign bodies/material removed: no   Skin repair:    Repair method:  Sutures   Suture size:  5-0   Suture material:  Prolene   Suture technique:  Simple interrupted   Number of sutures:  1  Approximation:    Approximation:  Close   Vermilion border: well-aligned   Post-procedure details:    Dressing:  Sterile dressing   Patient tolerance of procedure:  Tolerated well, no immediate complications   (including critical care time)  Medications Ordered in ED Medications  lidocaine-EPINEPHrine (XYLOCAINE W/EPI) 2 %-1:200000 (PF) injection 5 mL (5 mLs Infiltration Given by Other 09/16/17 1452)  Tdap (BOOSTRIX) injection 0.5 mL (0.5 mLs Intramuscular Given 09/16/17 1453)     Initial Impression / Assessment and Plan / ED Course  I have reviewed the triage vital signs and the nursing notes.  Pertinent labs & imaging results that were available during my care of the patient were reviewed by me and considered in my medical decision making (see chart for details).     Presenting with bleeding laceration to left eyebrowstatus post unwitnessed fall at [place]. Pt on Plavix, likely cause of continued bleeding as fall was 2 days ago. Per Nursing staff at [place], patient at his mental baseline. Patient able to answer yes or no questions and only noting pain to left eyebrow with very small 2 mm laceration. Hemostasis achieved with lidocaine with epinephrine, and sutured closed. CT head negative for acute pathology. Tdap updated. No obvious injuries on exam, patient not in distress prior to discharge. Wound care instructions and return precautions in discharge paperwork.  Patient discussed with and seen by Dr. Adela Lank.  Final Clinical Impressions(s) / ED Diagnoses   Final diagnoses:  Laceration of scalp without  foreign body, initial encounter    New Prescriptions New Prescriptions   No medications on file     Russo, [place] N, PA-C 09/16/17 1844    Russo, [place] N, PA-C 09/16/17 1844    Melene Plan, DO 09/17/17 [phone]

## 2017-09-16 NOTE — ED Notes (Signed)
ED Provider at bedside. 

## 2017-09-16 NOTE — ED Notes (Signed)
Hand roll found beside pt.

## 2017-09-16 NOTE — ED Notes (Signed)
This RN called [facility] to ensure pt was on the list to be transported home by [employer]. Confirmed that pt was to be transported back to [facility] by Sharin Mons

## 2017-09-16 NOTE — ED Triage Notes (Signed)
Pt arrived via [facility] from [facility] where pt had a fall 2 days prior. Laceration bleeding through bandage today. NAD. Pt denies pain, A&OX4

## 2017-09-16 NOTE — ED Notes (Signed)
Patient transported to CT 

## 2019-03-04 DEATH — deceased

## 2019-04-05 IMAGING — CT CT CERVICAL SPINE W/O CM
4 of 8 series · 12 of 33 positions shown, 13 images · non-contrast
Comparison: None.

CLINICAL DATA: Head laceration after witnessed fall. No loss of
consciousness.

EXAM:
CT HEAD WITHOUT CONTRAST
CT CERVICAL SPINE WITHOUT CONTRAST
TECHNIQUE: Multidetector CT imaging of the head and cervical spine was
performed following the standard protocol without intravenous
contrast. Multiplanar CT image reconstructions of the cervical spine
were also generated.

[Series 5: coronal · coronal · 0.29mm/px · 1 of 74 slices shown]
[im 37/74  bone]
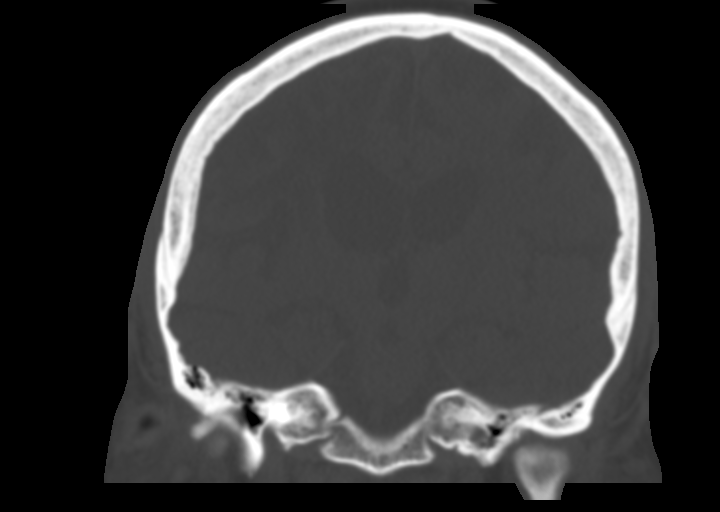

[Series 6: sagittal · sagittal · 0.29mm/px · 5 of 73 slices shown]
[im 13/73  bone]
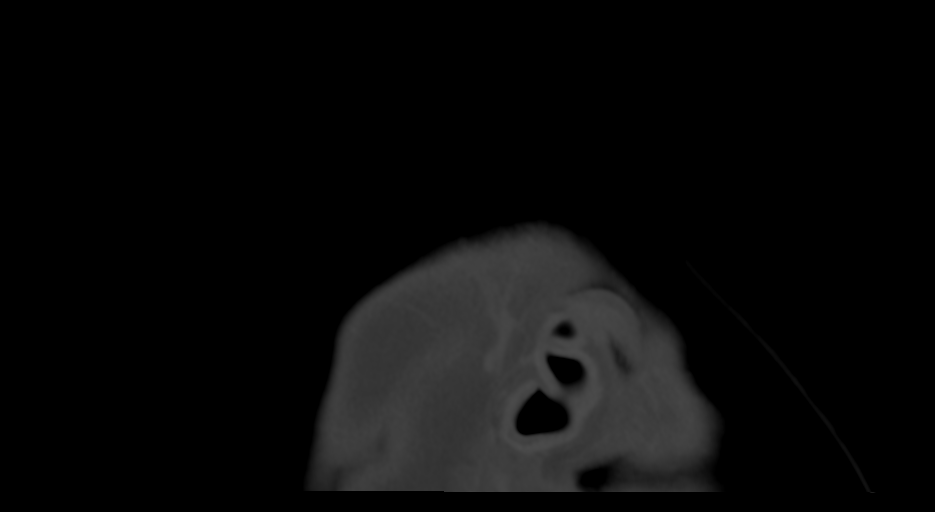
[im 25/73  bone]
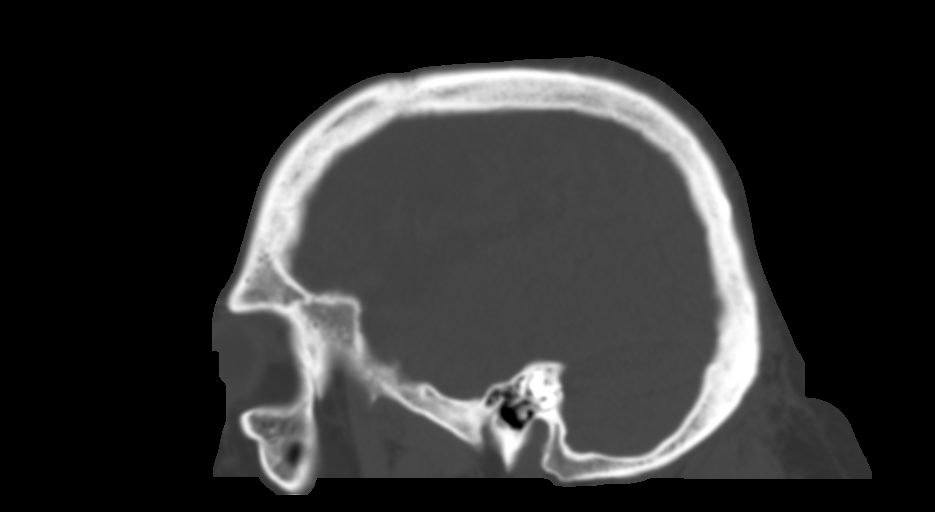
[im 37/73  bone]
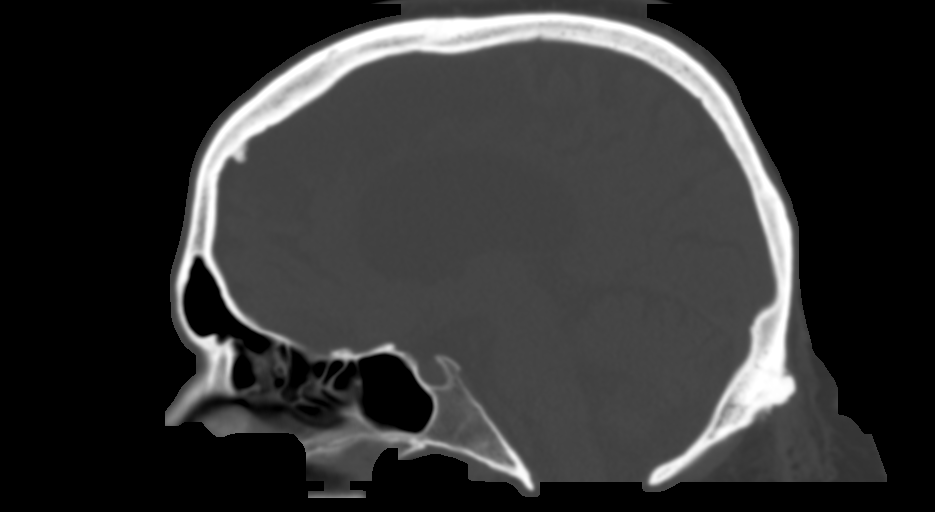
[im 49/73  bone]
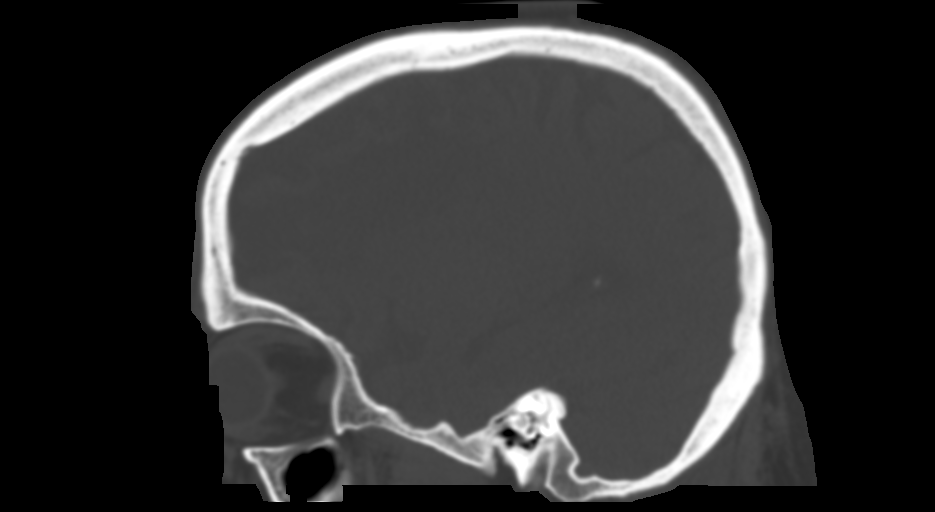
[im 61/73  bone]
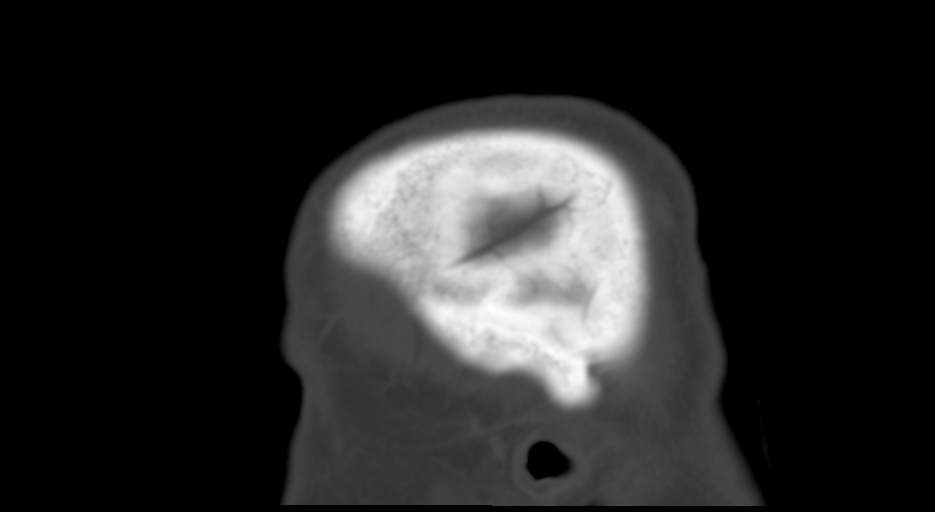

[Series 7: c-spine st · axial · 0.29mm/px · z∈[-194,-108]mm · 3 of 87 slices shown, 4 images]
[im 22/87  soft-tissue]
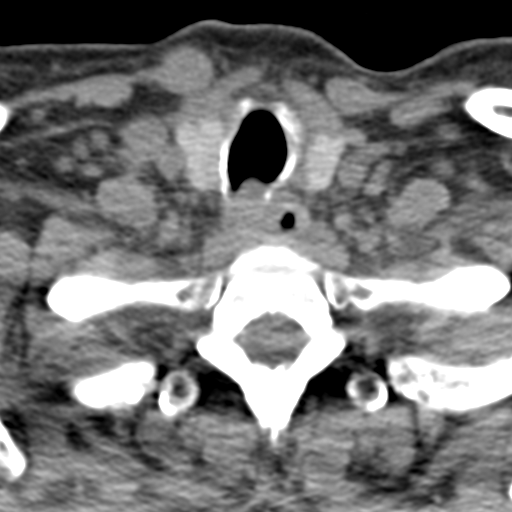
[im 22/87  bone]
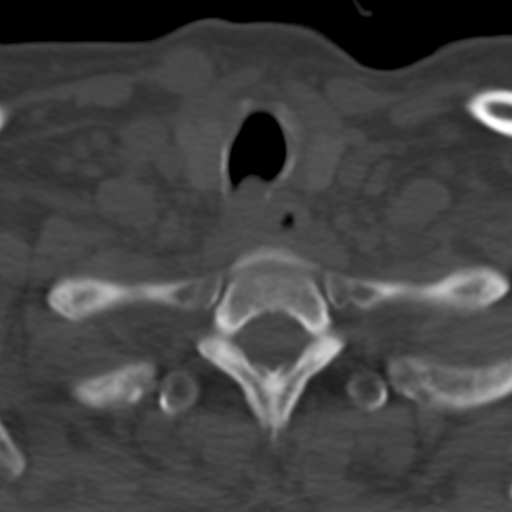
[im 44/87  bone]
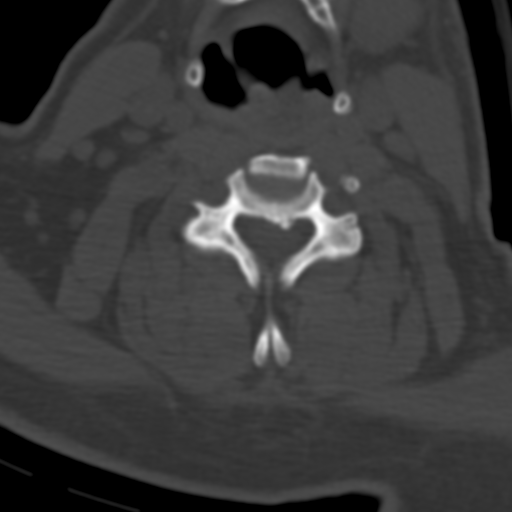
[im 65/87  bone]
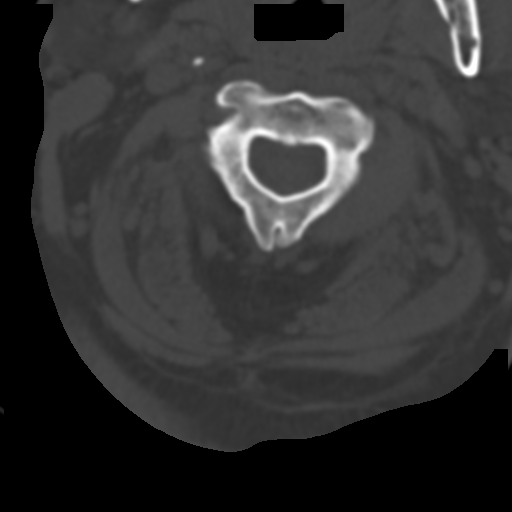

[Series 10: axial recon · axial · 0.23mm/px · z∈[-213,-128]mm · 3 of 95 slices shown]
[im 24/95  bone]
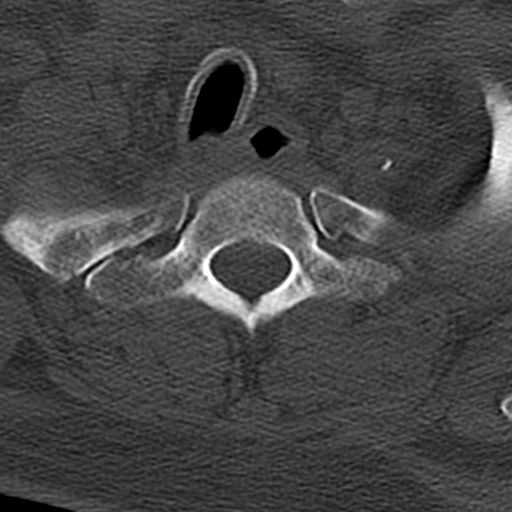
[im 48/95  bone]
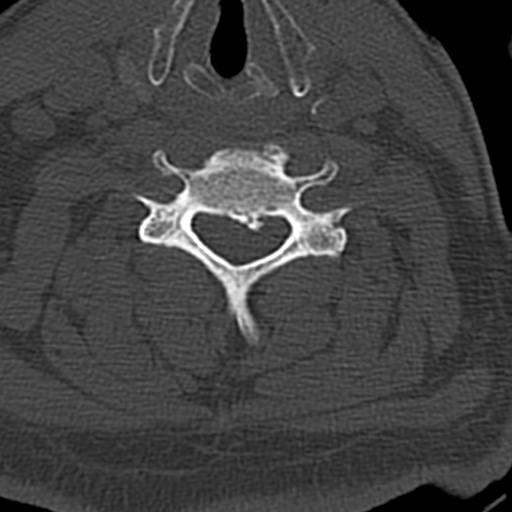
[im 71/95  bone]
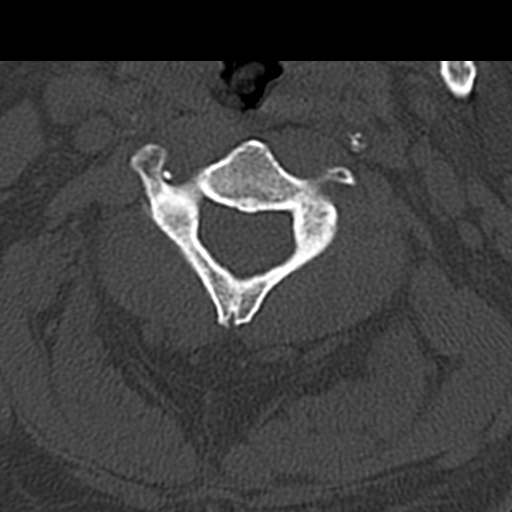

[12 of 33 positions shown; findings below may reference images not displayed]

FINDINGS: CT HEAD FINDINGS

Brain: .Right frontal parietal encephalomalacia is noted consistent
with old infarction. Left frontal encephalomalacia is also noted
consistent with old infarction. No mass effect or midline shift is
noted. Ventricular size is within normal limits. There is no
evidence of mass lesion, hemorrhage or acute infarction.

Vascular: No hyperdense vessel or unexpected calcification.

Skull: Left parietal craniotomy defect is noted. No acute fracture
is noted.

Sinuses/Orbits: No acute finding.

Other: None.

CT CERVICAL SPINE FINDINGS

Alignment: Normal.

Skull base and vertebrae: No acute fracture. No primary bone lesion
or focal pathologic process.

Soft tissues and spinal canal: No prevertebral fluid or swelling. No
visible canal hematoma.

Disc levels: Mild degenerative disc disease is noted at C5-6 with
anterior osteophyte formation.

Upper chest: Negative.

Other: None.
IMPRESSION: Bifrontal and right parietal encephalomalacia is noted consistent
with old infarction. No acute intracranial abnormality seen.

Mild degenerative changes are noted. No acute abnormality seen the
cervical spine.
